# Patient Record
Sex: Female | Born: 1970 | Race: White | Hispanic: No | Marital: Married | Smoking: Current every day smoker
Health system: Southern US, Community
[De-identification: ages and names within clinical notes are randomized; demographics above are authoritative.]

## PROBLEM LIST (undated history)

## (undated) DIAGNOSIS — Z720 Tobacco use: Secondary | ICD-10-CM

## (undated) DIAGNOSIS — I5181 Takotsubo syndrome: Secondary | ICD-10-CM

## (undated) DIAGNOSIS — E785 Hyperlipidemia, unspecified: Secondary | ICD-10-CM

## (undated) DIAGNOSIS — I2510 Atherosclerotic heart disease of native coronary artery without angina pectoris: Secondary | ICD-10-CM

## (undated) DIAGNOSIS — K5090 Crohn's disease, unspecified, without complications: Secondary | ICD-10-CM

## (undated) DIAGNOSIS — I513 Intracardiac thrombosis, not elsewhere classified: Secondary | ICD-10-CM

## (undated) HISTORY — PX: CHOLECYSTECTOMY: SHX55

## (undated) HISTORY — PX: HIP SURGERY: SHX245

## (undated) HISTORY — PX: KNEE SURGERY: SHX244

## (undated) HISTORY — DX: Intracardiac thrombosis, not elsewhere classified: I51.3

## (undated) HISTORY — PX: TONSILLECTOMY: SUR1361

## (undated) HISTORY — DX: Atherosclerotic heart disease of native coronary artery without angina pectoris: I25.10

---

## 2021-07-07 ENCOUNTER — Inpatient Hospital Stay (HOSPITAL_COMMUNITY)
Admission: RE | Admit: 2021-07-07 | Discharge: 2021-07-10 | DRG: 251 | Disposition: A | Discharge status: Home / Self Care | Payer: BC Managed Care – PPO | Source: Other Acute Inpatient Hospital | Attending: Cardiovascular Disease | Admitting: Cardiovascular Disease

## 2021-07-07 ENCOUNTER — Encounter (HOSPITAL_COMMUNITY): Payer: Self-pay | Admitting: Cardiovascular Disease

## 2021-07-07 ENCOUNTER — Inpatient Hospital Stay (HOSPITAL_COMMUNITY)
Admission: RE | Disposition: A | Discharge status: Home / Self Care | Payer: Self-pay | Source: Other Acute Inpatient Hospital | Attending: Cardiovascular Disease

## 2021-07-07 DIAGNOSIS — Z20822 Contact with and (suspected) exposure to covid-19: Secondary | ICD-10-CM | POA: Diagnosis present

## 2021-07-07 DIAGNOSIS — I2510 Atherosclerotic heart disease of native coronary artery without angina pectoris: Secondary | ICD-10-CM | POA: Diagnosis present

## 2021-07-07 DIAGNOSIS — K5090 Crohn's disease, unspecified, without complications: Secondary | ICD-10-CM | POA: Diagnosis present

## 2021-07-07 DIAGNOSIS — Z79899 Other long term (current) drug therapy: Secondary | ICD-10-CM | POA: Diagnosis not present

## 2021-07-07 DIAGNOSIS — I513 Intracardiac thrombosis, not elsewhere classified: Secondary | ICD-10-CM

## 2021-07-07 DIAGNOSIS — I2102 ST elevation (STEMI) myocardial infarction involving left anterior descending coronary artery: Secondary | ICD-10-CM | POA: Diagnosis present

## 2021-07-07 DIAGNOSIS — Z9049 Acquired absence of other specified parts of digestive tract: Secondary | ICD-10-CM

## 2021-07-07 DIAGNOSIS — I213 ST elevation (STEMI) myocardial infarction of unspecified site: Secondary | ICD-10-CM | POA: Diagnosis not present

## 2021-07-07 DIAGNOSIS — F17210 Nicotine dependence, cigarettes, uncomplicated: Secondary | ICD-10-CM | POA: Diagnosis present

## 2021-07-07 DIAGNOSIS — I255 Ischemic cardiomyopathy: Secondary | ICD-10-CM | POA: Diagnosis present

## 2021-07-07 DIAGNOSIS — E785 Hyperlipidemia, unspecified: Secondary | ICD-10-CM

## 2021-07-07 HISTORY — PX: CORONARY/GRAFT ACUTE MI REVASCULARIZATION: CATH118305

## 2021-07-07 HISTORY — DX: Hyperlipidemia, unspecified: E78.5

## 2021-07-07 HISTORY — DX: ST elevation (STEMI) myocardial infarction of unspecified site: I21.3

## 2021-07-07 HISTORY — DX: Crohn's disease, unspecified, without complications: K50.90

## 2021-07-07 HISTORY — DX: Tobacco use: Z72.0

## 2021-07-07 HISTORY — PX: LEFT HEART CATH AND CORONARY ANGIOGRAPHY: CATH118249

## 2021-07-07 HISTORY — DX: Takotsubo syndrome: I51.81

## 2021-07-07 LAB — COMPREHENSIVE METABOLIC PANEL
ALT: 36 U/L (ref 0–44)
AST: 46 U/L — ABNORMAL HIGH (ref 15–41)
Albumin: 3.5 g/dL (ref 3.5–5.0)
Alkaline Phosphatase: 71 U/L (ref 38–126)
Anion gap: 10 (ref 5–15)
BUN: 9 mg/dL (ref 6–20)
CO2: 20 mmol/L — ABNORMAL LOW (ref 22–32)
Calcium: 8.4 mg/dL — ABNORMAL LOW (ref 8.9–10.3)
Chloride: 98 mmol/L (ref 98–111)
Creatinine, Ser: 0.78 mg/dL (ref 0.44–1.00)
GFR, Estimated: >60 mL/min (ref >60)
Glucose, Bld: 108 mg/dL — ABNORMAL HIGH (ref 70–99)
Potassium: 3.4 mmol/L — ABNORMAL LOW (ref 3.5–5.1)
Sodium: 128 mmol/L — ABNORMAL LOW (ref 135–145)
Total Bilirubin: 0.6 mg/dL (ref 0.3–1.2)
Total Protein: 5.9 g/dL — ABNORMAL LOW (ref 6.5–8.1)

## 2021-07-07 LAB — LIPID PANEL
Cholesterol: 239 mg/dL — ABNORMAL HIGH (ref 0–200)
HDL: 43 mg/dL (ref >40)
LDL Cholesterol: 165 mg/dL — ABNORMAL HIGH (ref 0–99)
Total CHOL/HDL Ratio: 5.6 RATIO
Triglycerides: 153 mg/dL — ABNORMAL HIGH (ref <150)
VLDL: 31 mg/dL (ref 0–40)

## 2021-07-07 LAB — CBC WITH DIFFERENTIAL/PLATELET
Abs Immature Granulocytes: 0.08 10*3/uL — ABNORMAL HIGH (ref 0.00–0.07)
Basophils Absolute: 0.1 10*3/uL (ref 0.0–0.1)
Basophils Relative: 0 %
Eosinophils Absolute: 0 10*3/uL (ref 0.0–0.5)
Eosinophils Relative: 0 %
HCT: 35.1 % — ABNORMAL LOW (ref 36.0–46.0)
Hemoglobin: 11.9 g/dL — ABNORMAL LOW (ref 12.0–15.0)
Immature Granulocytes: 1 %
Lymphocytes Relative: 20 %
Lymphs Abs: 3.1 10*3/uL (ref 0.7–4.0)
MCH: 30.1 pg (ref 26.0–34.0)
MCHC: 33.9 g/dL (ref 30.0–36.0)
MCV: 88.9 fL (ref 80.0–100.0)
Monocytes Absolute: 0.6 10*3/uL (ref 0.1–1.0)
Monocytes Relative: 4 %
Neutro Abs: 11.9 10*3/uL — ABNORMAL HIGH (ref 1.7–7.7)
Neutrophils Relative %: 75 %
Platelets: 440 10*3/uL — ABNORMAL HIGH (ref 150–400)
RBC: 3.95 MIL/uL (ref 3.87–5.11)
RDW: 13.2 % (ref 11.5–15.5)
WBC: 15.7 10*3/uL — ABNORMAL HIGH (ref 4.0–10.5)
nRBC: 0 % (ref 0.0–0.2)

## 2021-07-07 LAB — PROTIME-INR
INR: 1.5 — ABNORMAL HIGH (ref 0.8–1.2)
Prothrombin Time: 17.8 seconds — ABNORMAL HIGH (ref 11.4–15.2)

## 2021-07-07 LAB — RESP PANEL BY RT-PCR (FLU A&B, COVID) ARPGX2
Influenza A by PCR: NEGATIVE
Influenza B by PCR: NEGATIVE
SARS Coronavirus 2 by RT PCR: NEGATIVE

## 2021-07-07 LAB — TROPONIN I (HIGH SENSITIVITY): Troponin I (High Sensitivity): 2247 ng/L (ref <18)

## 2021-07-07 LAB — APTT: aPTT: >200 seconds (ref 24–36)

## 2021-07-07 LAB — POCT ACTIVATED CLOTTING TIME: Activated Clotting Time: 444 seconds

## 2021-07-07 LAB — MRSA NEXT GEN BY PCR, NASAL: MRSA by PCR Next Gen: NOT DETECTED

## 2021-07-07 SURGERY — CORONARY/GRAFT ACUTE MI REVASCULARIZATION
Anesthesia: LOCAL

## 2021-07-07 MED ORDER — LIDOCAINE HCL (PF) 1 % IJ SOLN
INTRAMUSCULAR | Status: DC | PRN
  Administered 2021-07-07: 2 mL

## 2021-07-07 MED ORDER — ATORVASTATIN CALCIUM 80 MG PO TABS
80.0000 mg | ORAL_TABLET | Freq: Every day | ORAL | Status: DC
  Administered 2021-07-08 – 2021-07-10 (×3): 80 mg via ORAL
  Filled 2021-07-07 (×3): qty 1

## 2021-07-07 MED ORDER — VERAPAMIL HCL 2.5 MG/ML IV SOLN
INTRAVENOUS | Status: AC
  Filled 2021-07-07: qty 2

## 2021-07-07 MED ORDER — FENTANYL CITRATE (PF) 100 MCG/2ML IJ SOLN
INTRAMUSCULAR | Status: AC
  Filled 2021-07-07: qty 2

## 2021-07-07 MED ORDER — IOHEXOL 350 MG/ML SOLN
INTRAVENOUS | Status: DC | PRN
  Administered 2021-07-07: 90 mL

## 2021-07-07 MED ORDER — TIROFIBAN HCL IN NACL 5-0.9 MG/100ML-% IV SOLN
INTRAVENOUS | Status: AC
  Filled 2021-07-07: qty 100

## 2021-07-07 MED ORDER — SODIUM CHLORIDE 0.9% FLUSH: 3.0000 mL | INTRAVENOUS | Status: DC | PRN

## 2021-07-07 MED ORDER — HYDRALAZINE HCL 20 MG/ML IJ SOLN: 10.0000 mg | INTRAMUSCULAR | Status: AC | PRN

## 2021-07-07 MED ORDER — SODIUM CHLORIDE 0.9 % IV SOLN: 250.0000 mL | INTRAVENOUS | Status: DC | PRN

## 2021-07-07 MED ORDER — MIDAZOLAM HCL 2 MG/2ML IJ SOLN
INTRAMUSCULAR | Status: DC | PRN
  Administered 2021-07-07: 2 mg via INTRAVENOUS

## 2021-07-07 MED ORDER — OXYCODONE HCL 5 MG PO TABS
5.0000 mg | ORAL_TABLET | ORAL | Status: DC | PRN
  Administered 2021-07-08 – 2021-07-09 (×4): 5 mg via ORAL
  Filled 2021-07-07 (×4): qty 1

## 2021-07-07 MED ORDER — SODIUM CHLORIDE 0.9% FLUSH
3.0000 mL | Freq: Two times a day (BID) | INTRAVENOUS | Status: DC
  Administered 2021-07-08 – 2021-07-10 (×6): 3 mL via INTRAVENOUS

## 2021-07-07 MED ORDER — SODIUM CHLORIDE 0.9 % IV SOLN: INTRAVENOUS | Status: AC

## 2021-07-07 MED ORDER — HEPARIN (PORCINE) IN NACL 1000-0.9 UT/500ML-% IV SOLN
INTRAVENOUS | Status: AC
  Filled 2021-07-07: qty 1000

## 2021-07-07 MED ORDER — HEPARIN SODIUM (PORCINE) 1000 UNIT/ML IJ SOLN
INTRAMUSCULAR | Status: DC | PRN
  Administered 2021-07-07: 6000 [IU] via INTRAVENOUS
  Administered 2021-07-07: 4000 [IU] via INTRAVENOUS

## 2021-07-07 MED ORDER — VERAPAMIL HCL 2.5 MG/ML IV SOLN
INTRAVENOUS | Status: DC | PRN
  Administered 2021-07-07: 10 mL via INTRA_ARTERIAL

## 2021-07-07 MED ORDER — LABETALOL HCL 5 MG/ML IV SOLN: 10.0000 mg | INTRAVENOUS | Status: AC | PRN

## 2021-07-07 MED ORDER — POTASSIUM CHLORIDE CRYS ER 20 MEQ PO TBCR
40.0000 meq | EXTENDED_RELEASE_TABLET | Freq: Once | ORAL | Status: AC
  Administered 2021-07-07: 40 meq via ORAL
  Filled 2021-07-07: qty 2

## 2021-07-07 MED ORDER — MELATONIN 3 MG PO TABS
3.0000 mg | ORAL_TABLET | Freq: Every day | ORAL | Status: DC
  Administered 2021-07-07 – 2021-07-09 (×3): 3 mg via ORAL
  Filled 2021-07-07 (×3): qty 1

## 2021-07-07 MED ORDER — METOPROLOL TARTRATE 25 MG PO TABS
25.0000 mg | ORAL_TABLET | Freq: Two times a day (BID) | ORAL | Status: DC
  Administered 2021-07-07 – 2021-07-10 (×6): 25 mg via ORAL
  Filled 2021-07-07 (×6): qty 1

## 2021-07-07 MED ORDER — TIROFIBAN HCL IN NACL 5-0.9 MG/100ML-% IV SOLN
INTRAVENOUS | Status: AC | PRN
  Administered 2021-07-07: .15 ug/kg/min via INTRAVENOUS

## 2021-07-07 MED ORDER — LIDOCAINE HCL (PF) 1 % IJ SOLN
INTRAMUSCULAR | Status: AC
  Filled 2021-07-07: qty 30

## 2021-07-07 MED ORDER — FENTANYL CITRATE (PF) 100 MCG/2ML IJ SOLN
INTRAMUSCULAR | Status: DC | PRN
  Administered 2021-07-07: 50 ug via INTRAVENOUS

## 2021-07-07 MED ORDER — MIDAZOLAM HCL 2 MG/2ML IJ SOLN
INTRAMUSCULAR | Status: AC
  Filled 2021-07-07: qty 2

## 2021-07-07 MED ORDER — ACETAMINOPHEN 325 MG PO TABS
650.0000 mg | ORAL_TABLET | ORAL | Status: DC | PRN
  Administered 2021-07-07 – 2021-07-09 (×4): 650 mg via ORAL
  Filled 2021-07-07 (×4): qty 2

## 2021-07-07 MED ORDER — HEPARIN (PORCINE) IN NACL 1000-0.9 UT/500ML-% IV SOLN
INTRAVENOUS | Status: DC | PRN
  Administered 2021-07-07 (×2): 500 mL

## 2021-07-07 MED ORDER — TIROFIBAN (AGGRASTAT) BOLUS VIA INFUSION
INTRAVENOUS | Status: DC | PRN
  Administered 2021-07-07: 1757.5 ug via INTRAVENOUS

## 2021-07-07 MED ORDER — TICAGRELOR 90 MG PO TABS
ORAL_TABLET | ORAL | Status: DC | PRN
  Administered 2021-07-07: 180 mg via ORAL

## 2021-07-07 MED ORDER — ONDANSETRON HCL 4 MG/2ML IJ SOLN
4.0000 mg | Freq: Four times a day (QID) | INTRAMUSCULAR | Status: DC | PRN
  Administered 2021-07-08 – 2021-07-09 (×2): 4 mg via INTRAVENOUS
  Filled 2021-07-07 (×2): qty 2

## 2021-07-07 MED ORDER — ASPIRIN 81 MG PO CHEW
81.0000 mg | CHEWABLE_TABLET | Freq: Every day | ORAL | Status: DC
  Administered 2021-07-08 – 2021-07-10 (×3): 81 mg via ORAL
  Filled 2021-07-07 (×3): qty 1

## 2021-07-07 MED ORDER — TICAGRELOR 90 MG PO TABS
90.0000 mg | ORAL_TABLET | Freq: Two times a day (BID) | ORAL | Status: DC
  Administered 2021-07-08 – 2021-07-10 (×5): 90 mg via ORAL
  Filled 2021-07-07 (×5): qty 1

## 2021-07-07 MED ORDER — HEPARIN SODIUM (PORCINE) 1000 UNIT/ML IJ SOLN
INTRAMUSCULAR | Status: AC
  Filled 2021-07-07: qty 10

## 2021-07-07 MED ORDER — MORPHINE SULFATE (PF) 2 MG/ML IV SOLN: 2.0000 mg | INTRAVENOUS | Status: DC | PRN

## 2021-07-07 MED ORDER — TICAGRELOR 90 MG PO TABS
ORAL_TABLET | ORAL | Status: AC
  Filled 2021-07-07: qty 2

## 2021-07-07 MED ORDER — NITROGLYCERIN 1 MG/10 ML FOR IR/CATH LAB
INTRA_ARTERIAL | Status: AC
  Filled 2021-07-07: qty 10

## 2021-07-07 MED ORDER — TIROFIBAN HCL IN NACL 5-0.9 MG/100ML-% IV SOLN
0.1500 ug/kg/min | INTRAVENOUS | Status: AC
  Administered 2021-07-07 – 2021-07-08 (×2): 0.15 ug/kg/min via INTRAVENOUS
  Filled 2021-07-07 (×3): qty 100

## 2021-07-07 SURGICAL SUPPLY — 16 items
BALLN SAPPHIRE 2.0X12 (BALLOONS) ×2
BALLOON SAPPHIRE 2.0X12 (BALLOONS) IMPLANT
CATH INFINITI 5 FR JL3.5 (CATHETERS) ×1 IMPLANT
CATH INFINITI 5FR ANG PIGTAIL (CATHETERS) ×1 IMPLANT
CATH INFINITI JR4 5F (CATHETERS) ×1 IMPLANT
CATH VISTA GUIDE 6FR XBLAD3.5 (CATHETERS) ×1 IMPLANT
DEVICE RAD COMP TR BAND LRG (VASCULAR PRODUCTS) ×1 IMPLANT
GLIDESHEATH SLEND SS 6F .021 (SHEATH) ×1 IMPLANT
GUIDEWIRE INQWIRE 1.5J.035X260 (WIRE) IMPLANT
INQWIRE 1.5J .035X260CM (WIRE) ×2
KIT ENCORE 26 ADVANTAGE (KITS) ×1 IMPLANT
KIT HEART LEFT (KITS) ×2 IMPLANT
PACK CARDIAC CATHETERIZATION (CUSTOM PROCEDURE TRAY) ×2 IMPLANT
TRANSDUCER W/STOPCOCK (MISCELLANEOUS) ×2 IMPLANT
TUBING CIL FLEX 10 FLL-RA (TUBING) ×2 IMPLANT
WIRE COUGAR XT STRL 190CM (WIRE) ×1 IMPLANT

## 2021-07-07 NOTE — H&P (Signed)
Cardiology Admission History and Physical:   Patient ID: Abigail Finley MRN: [id number]; DOB: June 17, 1971   Admission date: 07/07/2021  PCP:  No primary care provider on file.   [facility] Providers Cardiologist:  None   New}    Chief Complaint:  Chest pain  Patient Profile:   Abigail Finley is a 51 y.o. female with history of tobacco abuse, hyperlipidemia, Crohns's disease and prior stress induced cardiomyoathy who is transferred from [facility] ED with acute MI (ST elevation inferior and anterior leads).    History of Present Illness:    [name] is a 51 y.o. female with history of tobacco abuse, hyperlipidemia, Crohns's disease and prior stress induced cardiomyoathy who is transferred from [facility] ED with acute inferior STEMI. She reports having a normal cardiac cath two years ago in [place] and was told she had pericarditis and possible a stress induced cardiomyopathy. Chest pain began this afternoon around 3 pm. Persistent chest pain and still ongoing. EKG in the ED at [facility] with 1-2 inferior ST elevation that improved on subsequent EKG. Also with anterior ST elevation. Code STEMI called by ED staff and patient transferred to [facility] for cardiac cath. Chest pain on arrival to [facility].    Past Medical History:  Diagnosis Date   Crohn disease ([place])    Hyperlipidemia    STEMI (ST elevation myocardial infarction) ([place]) 07/07/2021   Stress-induced cardiomyopathy    Tobacco abuse     Past Surgical History:  Procedure Laterality Date   CHOLECYSTECTOMY     HIP SURGERY     KNEE SURGERY     TONSILLECTOMY       Medications Prior to Admission: Prior to Admission medications   Not on File  Lexapro Reflux medication  Allergies:   Not on File  Social History:   Social History   Socioeconomic History   Marital status: Married    Spouse name: Not on file   Number of children: Not on file   Years of education: Not on file   Highest  education level: Not on file  Occupational History   Not on file  Tobacco Use   Smoking status: Every Day    Types: Cigarettes   Smokeless tobacco: Not on file  Substance and Sexual Activity   Alcohol use: Yes    Comment: social   Drug use: Not on file   Sexual activity: Not on file  Other Topics Concern   Not on file  Social History Narrative   Not on file   Social Determinants of Health   Financial Resource Strain: Not on file  Food Insecurity: Not on file  Transportation Needs: Not on file  Physical Activity: Not on file  Stress: Not on file  Social Connections: Not on file  Intimate Partner Violence: Not on file    Family History:   The patient's family history includes Heart disease in her father.    ROS:  Please see the history of present illness.  All other ROS reviewed and negative.     Physical Exam/Data:   Vitals:   07/07/21 2045 07/07/21 2050 07/07/21 2055 07/07/21 2100  BP: 139/84 (!) 141/93 (!) 147/89 (!) 147/90  Pulse: 82 86 81 79  Resp: 15 16 12 13   SpO2: 97% 98% 99% 100%  Weight:      Height:       No intake or output data in the 24 hours ending 07/07/21 2126 Last 3 Weights 07/07/2021  Weight (lbs) 155 lb  Weight (kg) 70.308 kg     Body mass index is 26.61 kg/m.  General:  Well nourished, well developed, in no acute distress HEENT: normal Neck: no JVD Vascular: No carotid bruits; Distal pulses 2+ bilaterally   Cardiac:  normal S1, S2; RRR; no murmur  Lungs:  clear to auscultation bilaterally, no wheezing, rhonchi or rales  Abd: soft, nontender, no hepatomegaly  Ext: no LE edema Musculoskeletal:  No deformities, BUE and BLE strength normal and equal Skin: warm and dry  Neuro:  CNs 2-12 intact, no focal abnormalities noted Psych:  Normal affect    EKG:  The ECG that was done  was personally reviewed and demonstrates sinus, 1 mm inferior ST elevation  Relevant CV Studies:  Cardiac cath:   2nd Mrg lesion is 100% stenosed.   Lat 2nd  Mrg lesion is 100% stenosed.   Dist LAD lesion is 100% stenosed.   Balloon angioplasty was performed using a BALLN SAPPHIRE 2.0X12.   Post intervention, there is a 100% residual stenosis.   Acute MI with occlusion of the distal/apical LAD and very distal segments of the bifurcating second obtuse marginal branch. This suggests an embolic phenomenon.  The LAD is a large caliber vessel with no other lesions noted. The distal LAD is occluded before the vessel wraps around the apex.  The circumflex is a large vessel with a small first obtuse marginal branch and a moderate caliber bifurcating second obtuse marginal branch. The very distal segments of both sub-branches of the second obtuse marginal branch are occluded. (Too small for PCI) The RCA is a normal, dominant vessel.  Mild elevation of LVEDP   Recommendations: Her presentation is classic for ACS but her coronary findings suggest possible embolic event with three branches affected (distal LAD, both distal segments of bifurcating OM). I attempted PCI of the LAD with balloon angioplasty but could not restore flow and did not place a stent. Will plan to continue DAPT with ASA/Brilinta. Aggrastat infusion for 18 hours. Echo in the am. If she has apical WMA, will need to exclude thrombus. High intensity statin, beta blocker.   Diagnostic Dominance: Right Intervention  Implants     No implant documentation for this case.   Syngo Images   Show images for CARDIAC CATHETERIZATION Images on Long Term Storage   Show images for Janaria, [place] to Procedure Log  Procedure Log    Hemo Data  Flowsheet Row Most Recent Value  AO Systolic Pressure 99991111 mmHg  AO Diastolic Pressure 78 mmHg  AO Mean 95 mmHg  LV Systolic Pressure 123456 mmHg  LV Diastolic Pressure 8 mmHg  LV EDP 18 mmHg  AOp Systolic Pressure 123456 mmHg  AOp Diastolic Pressure 84 mmHg  AOp Mean Pressure XX123456 mmHg  LVp Systolic Pressure 123456 mmHg  LVp Diastolic Pressure 8 mmHg   LVp EDP Pressure 20 mmHg    Laboratory Data:  High Sensitivity Troponin:  No results for input(s): TROPONINIHS in the last 720 hours.    ChemistryNo results for input(s): NA, K, CL, CO2, GLUCOSE, BUN, CREATININE, CALCIUM, MG, GFRNONAA, GFRAA, ANIONGAP in the last 168 hours.  No results for input(s): PROT, ALBUMIN, AST, ALT, ALKPHOS, BILITOT in the last 168 hours. Lipids No results for input(s): CHOL, TRIG, HDL, LABVLDL, LDLCALC, CHOLHDL in the last 168 hours. Hematology Recent Labs  Lab 07/07/21 2036  WBC 15.7*  RBC 3.95  HGB 11.9*  HCT 35.1*  MCV 88.9  MCH 30.1  MCHC 33.9  RDW 13.2  PLT  440*   Thyroid No results for input(s): TSH, FREET4 in the last 168 hours. BNPNo results for input(s): BNP, PROBNP in the last 168 hours.  DDimer No results for input(s): DDIMER in the last 168 hours.   Assessment and Plan:   Acute MI: Her EKG was suggestive of inferior and anterior injury. Cardiac cath with occlusion of the distal/apical LAD and very distal segments of the both of the sub-branches of OM2. This suggests an embolic event rather than plaque rupture. This does not appear to be SCAD as the remainder of her vessels are normal. I was unable to restore flow down the LAD with balloon angioplasty. Will plan to monitor in the ICU tonight. Aggrastat infusion for 18 hours. ASA/Brilinta/statin/beta blocker. Echo tomorrow to assess LV function and exclude thrombus.    Risk Assessment/Risk Scores:  {  Severity of Illness: The appropriate patient status for this patient is INPATIENT. Inpatient status is judged to be reasonable and necessary in order to provide the required intensity of service to ensure the patient's safety. The patient's presenting symptoms, physical exam findings, and initial radiographic and laboratory data in the context of their chronic comorbidities is felt to place them at high risk for further clinical deterioration. Furthermore, it is not anticipated that the patient  will be medically stable for discharge from the hospital within 2 midnights of admission.   * I certify that at the point of admission it is my clinical judgment that the patient will require inpatient hospital care spanning beyond 2 midnights from the point of admission due to high intensity of service, high risk for further deterioration and high frequency of surveillance required.*   For questions or updates, please contact [place] Please consult [email or web address] for contact info under     Signed, Lauree Chandler, MD  07/07/2021 9:26 PM

## 2021-07-08 ENCOUNTER — Other Ambulatory Visit: Payer: Self-pay

## 2021-07-08 ENCOUNTER — Inpatient Hospital Stay (HOSPITAL_COMMUNITY): Payer: BC Managed Care – PPO

## 2021-07-08 ENCOUNTER — Other Ambulatory Visit (HOSPITAL_COMMUNITY): Payer: Self-pay

## 2021-07-08 ENCOUNTER — Encounter (HOSPITAL_COMMUNITY): Payer: Self-pay | Admitting: Cardiovascular Disease

## 2021-07-08 DIAGNOSIS — I2510 Atherosclerotic heart disease of native coronary artery without angina pectoris: Secondary | ICD-10-CM

## 2021-07-08 DIAGNOSIS — I2102 ST elevation (STEMI) myocardial infarction involving left anterior descending coronary artery: Secondary | ICD-10-CM | POA: Diagnosis not present

## 2021-07-08 LAB — BASIC METABOLIC PANEL
Anion gap: 13 (ref 5–15)
BUN: 9 mg/dL (ref 6–20)
CO2: 18 mmol/L — ABNORMAL LOW (ref 22–32)
Calcium: 8.8 mg/dL — ABNORMAL LOW (ref 8.9–10.3)
Chloride: 106 mmol/L (ref 98–111)
Creatinine, Ser: 0.7 mg/dL (ref 0.44–1.00)
GFR, Estimated: >60 mL/min (ref >60)
Glucose, Bld: 96 mg/dL (ref 70–99)
Potassium: 4 mmol/L (ref 3.5–5.1)
Sodium: 137 mmol/L (ref 135–145)

## 2021-07-08 LAB — HEPATIC FUNCTION PANEL
ALT: 42 U/L (ref 0–44)
AST: 117 U/L — ABNORMAL HIGH (ref 15–41)
Albumin: 3.8 g/dL (ref 3.5–5.0)
Alkaline Phosphatase: 78 U/L (ref 38–126)
Bilirubin, Direct: 0.3 mg/dL — ABNORMAL HIGH (ref 0.0–0.2)
Indirect Bilirubin: 1 mg/dL — ABNORMAL HIGH (ref 0.3–0.9)
Total Bilirubin: 1.3 mg/dL — ABNORMAL HIGH (ref 0.3–1.2)
Total Protein: 6.3 g/dL — ABNORMAL LOW (ref 6.5–8.1)

## 2021-07-08 LAB — ECHOCARDIOGRAM COMPLETE
Area-P 1/2: 5.31 cm2
Height: 64 in
S' Lateral: 3.7 cm
Weight: 2480 oz

## 2021-07-08 LAB — HEMOGLOBIN A1C
Hgb A1c MFr Bld: 5.6 % (ref 4.8–5.6)
Mean Plasma Glucose: 114 mg/dL

## 2021-07-08 LAB — TROPONIN I (HIGH SENSITIVITY): Troponin I (High Sensitivity): 12948 ng/L (ref <18)

## 2021-07-08 MED FILL — Nitroglycerin IV Soln 100 MCG/ML in D5W: INTRA_ARTERIAL | Qty: 10 | Status: AC

## 2021-07-08 NOTE — Progress Notes (Signed)
Progress Note  Patient Name: Abigail Finley Date of Encounter: 07/08/2021  [facility] Cardiologist: Dr. Darlina Guys   Subjective   Patient was admitted last night with anterior STEMI and underwent coronary angiography revealing occluded apical LAD.  She had unsuccessful attempted apical balloon angioplasty.  She is pain-free this morning.  Inpatient Medications    Scheduled Meds:  aspirin  81 mg Oral Daily   atorvastatin  80 mg Oral Daily   melatonin  3 mg Oral QHS   metoprolol tartrate  25 mg Oral BID   sodium chloride flush  3 mL Intravenous Q12H   ticagrelor  90 mg Oral BID   Continuous Infusions:  sodium chloride     tirofiban 0.15 mcg/kg/min (07/08/21 0644)   PRN Meds: sodium chloride, acetaminophen, morphine injection, ondansetron (ZOFRAN) IV, oxyCODONE, sodium chloride flush   Vital Signs    Vitals:   07/08/21 0630 07/08/21 0645 07/08/21 0700 07/08/21 0741  BP:  (!) 135/105 (!) 140/91   Pulse: 79 61 68   Resp: (!) 27 15 18    Temp:    98.1 F (36.7 C)  TempSrc:    Oral  SpO2: 98% 98% 96%   Weight:      Height:        Intake/Output Summary (Last 24 hours) at 07/08/2021 [id number] Last data filed at 07/08/2021 0644 Gross per 24 hour  Intake 1027.95 ml  Output 351 ml  Net 676.95 ml   Last 3 Weights 07/07/2021  Weight (lbs) 155 lb  Weight (kg) 70.308 kg      Telemetry    Sinus rhythm with PVCs- Personally Reviewed  ECG    Normal sinus rhythm at 62 with inferolateral T wave inversion.- Personally Reviewed  Physical Exam   GEN: No acute distress.   Neck: No JVD Cardiac: RRR, no murmurs, rubs, or gallops.  Respiratory: Clear to auscultation bilaterally. GI: Soft, nontender, non-distended  MS: No edema; No deformity. Neuro:  Nonfocal  Psych: Normal affect   Labs    High Sensitivity Troponin:   Recent Labs  Lab 07/07/21 2036 07/08/21 0047  TROPONINIHS 2,247* 12,948*     Chemistry Recent Labs  Lab 07/07/21 2036 07/08/21 0047  NA  128* 137  K 3.4* 4.0  CL 98 106  CO2 20* 18*  GLUCOSE 108* 96  BUN 9 9  CREATININE 0.78 0.70  CALCIUM 8.4* 8.8*  PROT 5.9* 6.3*  ALBUMIN 3.5 3.8  AST 46* 117*  ALT 36 42  ALKPHOS 71 78  BILITOT 0.6 1.3*  GFRNONAA >60 >60  ANIONGAP 10 13    Lipids  Recent Labs  Lab 07/07/21 2036  CHOL 239*  TRIG 153*  HDL 43  LDLCALC 165*  CHOLHDL 5.6    Hematology Recent Labs  Lab 07/07/21 2036  WBC 15.7*  RBC 3.95  HGB 11.9*  HCT 35.1*  MCV 88.9  MCH 30.1  MCHC 33.9  RDW 13.2  PLT 440*   Thyroid No results for input(s): TSH, FREET4 in the last 168 hours.  BNPNo results for input(s): BNP, PROBNP in the last 168 hours.  DDimer No results for input(s): DDIMER in the last 168 hours.   Radiology    CARDIAC CATHETERIZATION  Result Date: 07/07/2021   2nd Mrg lesion is 100% stenosed.   Lat 2nd Mrg lesion is 100% stenosed.   Dist LAD lesion is 100% stenosed.   Balloon angioplasty was performed using a BALLN SAPPHIRE 2.0X12.   Post intervention, there is a 100% residual  stenosis. Acute MI with occlusion of the distal/apical LAD and very distal segments of the bifurcating second obtuse marginal branch. This suggests an embolic phenomenon. The LAD is a large caliber vessel with no other lesions noted. The distal LAD is occluded before the vessel wraps around the apex. The circumflex is a large vessel with a small first obtuse marginal branch and a moderate caliber bifurcating second obtuse marginal branch. The very distal segments of both sub-branches of the second obtuse marginal branch are occluded. (Too small for PCI) The RCA is a normal, dominant vessel. Mild elevation of LVEDP Recommendations: Her presentation is classic for ACS but her coronary findings suggest possible embolic event with three branches affected (distal LAD, both distal segments of bifurcating OM). I attempted PCI of the LAD with balloon angioplasty but could not restore flow and did not place a stent. Will plan to  continue DAPT with ASA/Brilinta. Aggrastat infusion for 18 hours. Echo in the am. If she has apical WMA, will need to exclude thrombus. High intensity statin, beta blocker.    Cardiac Studies   Cardiac catheterization/PCI (07/08/2021)  Conclusion      2nd Mrg lesion is 100% stenosed.   Lat 2nd Mrg lesion is 100% stenosed.   Dist LAD lesion is 100% stenosed.   Balloon angioplasty was performed using a BALLN SAPPHIRE 2.0X12.   Post intervention, there is a 100% residual stenosis.   Acute MI with occlusion of the distal/apical LAD and very distal segments of the bifurcating second obtuse marginal branch. This suggests an embolic phenomenon.  The LAD is a large caliber vessel with no other lesions noted. The distal LAD is occluded before the vessel wraps around the apex.  The circumflex is a large vessel with a small first obtuse marginal branch and a moderate caliber bifurcating second obtuse marginal branch. The very distal segments of both sub-branches of the second obtuse marginal branch are occluded. (Too small for PCI) The RCA is a normal, dominant vessel.  Mild elevation of LVEDP   Recommendations: Her presentation is classic for ACS but her coronary findings suggest possible embolic event with three branches affected (distal LAD, both distal segments of bifurcating OM). I attempted PCI of the LAD with balloon angioplasty but could not restore flow and did not place a stent. Will plan to continue DAPT with ASA/Brilinta. Aggrastat infusion for 18 hours. Echo in the am. If she has apical WMA, will need to exclude thrombus. High intensity statin, beta blocker.    Coronary Diagrams  Diagnostic Dominance: Right Intervention   Patient Profile     51 y.o. married Caucasian female originally from [place] who is here with her husband for the [employer] in [place].  She has a history of Crohn's disease and untreated hyperlipidemia.  She apparently had a myocardial infarction in  [place] 2 years ago and was told she had disease at the "tip of her heart".  She developed chest pain rating to her upper extremities last night and was brought to the emergency room where her EKG suggest that she was having anterior STEMI and was taken emergently to the Cath Lab by Dr. Angelena Form for angiography and intervention.  Assessment & Plan    1: Anterior STEMI-troponins of 13,000.  Cath showed an apical LAD occlusion as well as occlusion of the distal subbranches of OM2.  This suggests an embolic event.  She did say that she had an apical occlusion 2 years ago.  Dr. Angelena Form was unable to restore antegrade  flow to the apex of the LAD.  He did not deploy a stent but performed angioplasty.  The patient is on Aggrastat for 18 hours which will end at 4:00 this afternoon as well as aspirin and Brilinta.  2D echo is pending.  2: Hyperlipidemia-total cholesterol 239 with an LDL of 165.  We will start on high-dose atorvastatin  It is unclear the origin of her chest pain or what the "culprit vessel was although it is undeniable that her troponins were elevated and she has EKG changes.  Will await 2D echo.  Dr. Angelena Form placed her on aspirin and Brilinta.  It certainly possible this is a thromboembolic phenomenon that would better be treated with a DOAC but the exact etiology is still unclear.  We will transfer her to a telemetry bed.  For questions or updates, please contact [place] Please consult [email or web address] for contact info under        Signed, Quay Burow, MD  07/08/2021, 8:22 AM

## 2021-07-08 NOTE — Discharge Instructions (Addendum)
Medication Changes: - START Aspirin 81mg  once daily, Plavix 75mg  once daily, and Eliquis 5mg  twice daily. We will discuss how long you need to be on each of these at your follow-up visit. Please take first dose of Plavix tonight 07/10/2021 and then next dose tomorrow morning. - START Metoprolol tartrate (Lopressor) 25mg  twice daily. - START Lipitor 80mg  daily. - STOP Omeprazole and START Protonix 40mg  daily instead for reflux.  Post STEMI: NO HEAVY LIFTING X 4 WEEKS. NO SEXUAL ACTIVITY X 4 WEEKS. NO DRIVING X 2 WEEKS. NO SOAKING BATHS, HOT TUBS, POOLS, ETC., X 7 DAYS.  Radial Site Care: Refer to this sheet in the next few weeks. These instructions provide you with information on caring for yourself after your procedure. Your caregiver may also give you more specific instructions. Your treatment has been planned according to current medical practices, but problems sometimes occur. Call your caregiver if you have any problems or questions after your procedure. HOME CARE INSTRUCTIONS You may shower the day after the procedure. Remove the bandage (dressing) and gently wash the site with plain soap and water. Gently pat the site dry.  Do not apply powder or lotion to the site.  Do not submerge the affected site in water for 3 to 5 days.  Inspect the site at least twice daily.  Do not flex or bend the affected arm for 24 hours.  No lifting over 5 pounds (2.3 kg) for 5 days after your procedure.  Do not drive home if you are discharged the same day of the procedure. Have someone else drive you.  What to expect: Any bruising will usually fade within 1 to 2 weeks.  Blood that collects in the tissue (hematoma) may be painful to the touch. It should usually decrease in size and tenderness within 1 to 2 weeks.  SEEK IMMEDIATE MEDICAL CARE IF: You have unusual pain at the radial site.  You have redness, warmth, swelling, or pain at the radial site.  You have drainage (other than a small amount of blood  on the dressing).  You have chills.  You have a fever or persistent symptoms for more than 72 hours.  You have a fever and your symptoms suddenly get worse.  Your arm becomes pale, cool, tingly, or numb.  You have heavy bleeding from the site. Hold pressure on the site.   -- Information on my medicine - ELIQUIS (apixaban)  This medication education was reviewed with me or my healthcare representative as part of my discharge preparation.    Why was Eliquis prescribed for you? Eliquis was prescribed for you to reduce the risk of forming blood clots that can cause a stroke if you have a medical condition called atrial fibrillation (a type of irregular heartbeat) OR to reduce the risk of a blood clots forming after orthopedic surgery.  What do You need to know about Eliquis ? Take your Eliquis TWICE DAILY - one tablet in the morning and one tablet in the evening with or without food.  It would be best to take the doses about the same time each day.  If you have difficulty swallowing the tablet whole please discuss with your pharmacist how to take the medication safely.  Take Eliquis exactly as prescribed by your doctor and DO NOT stop taking Eliquis without talking to the doctor who prescribed the medication.  Stopping may increase your risk of developing a new clot or stroke.  Refill your prescription before you run out.  After discharge,  you should have regular check-up appointments with your healthcare provider that is prescribing your Eliquis.  In the future your dose may need to be changed if your kidney function or weight changes by a significant amount or as you get older.  What do you do if you miss a dose? If you miss a dose, take it as soon as you remember on the same day and resume taking twice daily.  Do not take more than one dose of ELIQUIS at the same time.  Important Safety Information A possible side effect of Eliquis is bleeding. You should call your healthcare  provider right away if you experience any of the following: Bleeding from an injury or your nose that does not stop. Unusual colored urine (red or dark brown) or unusual colored stools (red or black). Unusual bruising for unknown reasons. A serious fall or if you hit your head (even if there is no bleeding).  Some medicines may interact with Eliquis and might increase your risk of bleeding or clotting while on Eliquis. To help avoid this, consult your healthcare provider or pharmacist prior to using any new prescription or non-prescription medications, including herbals, vitamins, non-steroidal anti-inflammatory drugs (NSAIDs) and supplements.  This website has more information on Eliquis (apixaban): http://www.eliquis.com/eliquis/home

## 2021-07-08 NOTE — Discharge Summary (Addendum)
Discharge Summary    Patient ID: Abigail Finley MRN: [id number]; DOB: [birth year]  Admit date: 07/07/2021 Discharge date: 07/10/2021  PCP:  Pcp, No   [facility] Providers Cardiologist:  Lauree Chandler, MD   {  Discharge Diagnoses    Principal Problem:   Acute ST elevation myocardial infarction (STEMI) due to occlusion of left anterior descending (LAD) coronary artery [facility]) Active Problems:   CAD (coronary artery disease)   Ischemic cardiomyopathy   LV (left ventricular) mural thrombus   Hyperlipidemia    Diagnostic Studies/Procedures    Left Cardiac Catheterization 07/07/2021:   2nd Mrg lesion is 100% stenosed.   Lat 2nd Mrg lesion is 100% stenosed.   Dist LAD lesion is 100% stenosed.   Balloon angioplasty was performed using a BALLN SAPPHIRE 2.0X12.   Post intervention, there is a 100% residual stenosis.   Acute MI with occlusion of the distal/apical LAD and very distal segments of the bifurcating second obtuse marginal branch. This suggests an embolic phenomenon.  The LAD is a large caliber vessel with no other lesions noted. The distal LAD is occluded before the vessel wraps around the apex.  The circumflex is a large vessel with a small first obtuse marginal branch and a moderate caliber bifurcating second obtuse marginal branch. The very distal segments of both sub-branches of the second obtuse marginal branch are occluded. (Too small for PCI) The RCA is a normal, dominant vessel.  Mild elevation of LVEDP   Recommendations: Her presentation is classic for ACS but her coronary findings suggest possible embolic event with three branches affected (distal LAD, both distal segments of bifurcating OM). I attempted PCI of the LAD with balloon angioplasty but could not restore flow and did not place a stent. Will plan to continue DAPT with ASA/Brilinta. Aggrastat infusion for 18 hours. Echo in the am. If she has apical WMA, will need to exclude thrombus. High intensity  statin, beta blocker.  Diagnostic Dominance: Right  Intervention   _____________  Complete Echocardiogram 07/08/2021: Impressions: 1. LV basal and mid segments are moving well with apical akinesis  consistent with apical LAD disease. LV strain worse in the apical  segments. Left ventricular ejection fraction, by estimation, is 50 to 55%.  The left ventricle has low normal function.  The left ventricle demonstrates regional wall motion abnormalities (see  scoring diagram/findings for description). Left ventricular diastolic  parameters were normal.   2. Right ventricular systolic function is normal. The right ventricular  size is normal. Tricuspid regurgitation signal is inadequate for assessing  PA pressure.   3. The mitral valve is grossly normal. No evidence of mitral valve  regurgitation.   4. The aortic valve is tricuspid. Aortic valve regurgitation is not  visualized. No aortic stenosis is present.   5. The inferior vena cava is normal in size with greater than 50%  respiratory variability, suggesting right atrial pressure of 3 mmHg.   Comparison(s): No prior Echocardiogram.  _______________  Limited Echocardiogram 07/09/2021: Impressions: 1. Limited echo with contrast EF 45-50% septal and apical akinesis After  review of the non contrast and constrast images I suspect there is a small  apical thrombus with undulating spontaneous contrast. Give cath showing  likely embolic event to  circumflex and distal LAD would anticoagulate for 6 months and can have  outpatient cardiac MRI to further evaluate.    History of Present Illness     Abigail Finley is a 51 y.o. female with a history of stress induced cardiomyopathy, hyperlipidemia, Crohn's  disease, and tobacco abuse who was transferred who presented to [facility] on 07/07/2021 with chest pain and was found to have a STEMI.   Patient reported having a norma cardiac catheterization 2 year ago in [place] and was  told at that time she had pericarditis and possible stress induced cardiac catheterization. Patient reported chest pain that began around 3pm on 07/07/2021. Pain persisted so she went to the [facility] ED where EKG showed showed ST elevations in inferior leads and anterior leads. Code STEMI was called and she was transferred to [facility] for cardiac catheterization. She was still having chest pain on arrival to [facility].  Hospital Course     Consultants: None  Acute STEMI Ischemic Cardiomyopathy LV Thrombus  Patient was admitted on 07/07/2021 for acute STEMI as stated above. High-sensitivity troponin peaked at 12,948. Emergent cardiac catheterization showed 100% stenosis of distal OM2, 100% stenosis of distal lateral branch of OM2, and 100% stenosis of distal LAD suggestive of an embolic phenomenon. PCI with balloon angioplasty of distal LAD lesion was attempted but could not restore flow and stent was not placed. He was treated with Aggrastat infusion and started on DAPT with Aspirin and Brilinta. Patient tolerated procedure well with no recurrent chest pain. Echo showed LVEF of 50-55% with apical akinesis. Repeat limited Echo on 07/09/2021 showed LVEF of 45-50% with septal and apical akinesis with suspect small apical thrombus with undulating spontaneous contrast. Therefore, we will stop Brilinta and start Plavix. Per Dr. Johnsie Cancel, no Plavix load necessary given she did not have PCI. Will get cardiac MRI to confirm LV thrombus and outpatient Event Monitor to rule out atrial fibrillation. Will likely be on triple therapy for 1 month at which time Plavix can likely be stopped per Dr. Johnsie Cancel. She was started on beta-blocker and high-intensity statin. BP soft at times so will hold on adding ACEi/ARB for now. May be able to add at outpatient follow-up.  Hyperlipidemia Lipid panel this admission: Total Cholesterol 239, Triglycerides 153, HDL 43, LDL 165. LDL goal <70 given CAD. Started on Lipitor 80mg   daily. Will need repeat lipid panel and LFTs in 6-8 weeks.   Patient seen and examined by Dr. Johnsie Cancel and determined to be stable for discharge. Outpatient follow-up will be arranged. Medications as below.  Did the patient have an acute coronary syndrome (MI, NSTEMI, STEMI, etc) this admission?:  Yes                               AHA/ACC Clinical Performance & Quality Measures: Aspirin prescribed? - Yes ADP Receptor Inhibitor (Plavix/Clopidogrel, Brilinta/Ticagrelor or Effient/Prasugrel) prescribed (includes medically managed patients)? - Yes Beta Blocker prescribed? - Yes High Intensity Statin (Lipitor 40-80mg  or Crestor 20-40mg ) prescribed? - Yes EF assessed during THIS hospitalization? - Yes For EF <40%, was ACEI/ARB prescribed? - Not Applicable (EF >/= [id number]) For EF <40%, Aldosterone Antagonist (Spironolactone or Eplerenone) prescribed? - Not Applicable (EF >/= [id number]) Cardiac Rehab Phase II ordered (including medically managed patients)? - Yes   The patient will be scheduled for a TOC follow up appointment within 2 weeks.  A message has been sent to the [facility] and Scheduling Pool at the office where the patient should be seen for follow up.  _____________  Discharge Vitals Blood pressure 109/75, pulse 66, temperature 98 F (36.7 C), temperature source Oral, resp. rate 16, height 5\' 4"  (1.626 m), weight 70.4 kg, SpO2 97 %.  Filed Weights  07/07/21 2015 07/10/21 0523  Weight: 70.3 kg 70.4 kg    Labs & Radiologic Studies    CBC Recent Labs    07/07/21 2036 07/10/21 0749  WBC 15.7* 9.9  NEUTROABS 11.9*  --   HGB 11.9* 12.3  HCT 35.1* 38.2  MCV 88.9 92.5  PLT 440* [id number]   Basic Metabolic Panel Recent Labs    07/08/21 0047 07/10/21 0749  NA 137 136  K 4.0 4.3  CL 106 105  CO2 18* 18*  GLUCOSE 96 101*  BUN 9 10  CREATININE 0.70 0.59  CALCIUM 8.8* 8.9   Liver Function Tests Recent Labs    07/07/21 2036 07/08/21 0047  AST 46* 117*  ALT 36 42  ALKPHOS 71 78   BILITOT 0.6 1.3*  PROT 5.9* 6.3*  ALBUMIN 3.5 3.8   No results for input(s): LIPASE, AMYLASE in the last 72 hours. High Sensitivity Troponin:   Recent Labs  Lab 07/07/21 2036 07/08/21 0047  TROPONINIHS 2,247* 12,948*    BNP Invalid input(s): POCBNP D-Dimer No results for input(s): DDIMER in the last 72 hours. Hemoglobin A1C Recent Labs    07/07/21 2036  HGBA1C 5.6   Fasting Lipid Panel Recent Labs    07/07/21 2036  CHOL 239*  HDL 43  LDLCALC 165*  TRIG 153*  CHOLHDL 5.6   Thyroid Function Tests No results for input(s): TSH, T4TOTAL, T3FREE, THYROIDAB in the last 72 hours.  Invalid input(s): FREET3 _____________  CARDIAC CATHETERIZATION  Result Date: 07/07/2021   2nd Mrg lesion is 100% stenosed.   Lat 2nd Mrg lesion is 100% stenosed.   Dist LAD lesion is 100% stenosed.   Balloon angioplasty was performed using a BALLN SAPPHIRE 2.0X12.   Post intervention, there is a 100% residual stenosis. Acute MI with occlusion of the distal/apical LAD and very distal segments of the bifurcating second obtuse marginal branch. This suggests an embolic phenomenon. The LAD is a large caliber vessel with no other lesions noted. The distal LAD is occluded before the vessel wraps around the apex. The circumflex is a large vessel with a small first obtuse marginal branch and a moderate caliber bifurcating second obtuse marginal branch. The very distal segments of both sub-branches of the second obtuse marginal branch are occluded. (Too small for PCI) The RCA is a normal, dominant vessel. Mild elevation of LVEDP Recommendations: Her presentation is classic for ACS but her coronary findings suggest possible embolic event with three branches affected (distal LAD, both distal segments of bifurcating OM). I attempted PCI of the LAD with balloon angioplasty but could not restore flow and did not place a stent. Will plan to continue DAPT with ASA/Brilinta. Aggrastat infusion for 18 hours. Echo in the am.  If she has apical WMA, will need to exclude thrombus. High intensity statin, beta blocker.   ECHOCARDIOGRAM COMPLETE  Result Date: 07/08/2021    ECHOCARDIOGRAM REPORT   Patient Name:   Abigail Finley Date of Exam: 07/08/2021 Medical Rec #:  [id number]       Height:       64.0 in Accession #:    [id number]      Weight:       155.0 lb Date of Birth:  [birth year]       BSA:          1.756 m Patient Age:    13 years        BP:           120/73 mmHg Patient Gender:  F               HR:           64 bpm. Exam Location:  Inpatient Procedure: 2D Echo, 3D Echo, Cardiac Doppler, Color Doppler and Strain Analysis Indications:    CAD Native Vessel I25.10  History:        Patient has prior history of Echocardiogram examinations, most                 recent 04/12/2019.  Sonographer:    Darlina Sicilian RDCS Referring Phys: [place]  1. LV basal and mid segments are moving well with apical akinesis consistent with apical LAD disease. LV strain worse in the apical segments. Left ventricular ejection fraction, by estimation, is 50 to 55%. The left ventricle has low normal function. The left ventricle demonstrates regional wall motion abnormalities (see scoring diagram/findings for description). Left ventricular diastolic parameters were normal.  2. Right ventricular systolic function is normal. The right ventricular size is normal. Tricuspid regurgitation signal is inadequate for assessing PA pressure.  3. The mitral valve is grossly normal. No evidence of mitral valve regurgitation.  4. The aortic valve is tricuspid. Aortic valve regurgitation is not visualized. No aortic stenosis is present.  5. The inferior vena cava is normal in size with greater than 50% respiratory variability, suggesting right atrial pressure of 3 mmHg. Comparison(s): No prior Echocardiogram. FINDINGS  Left Ventricle: LV basal and mid segments are moving well with apical akinesis consistent with apical LAD disease. LV strain  worse in the apical segments. Left ventricular ejection fraction, by estimation, is 50 to 55%. The left ventricle has low normal  function. The left ventricle demonstrates regional wall motion abnormalities. The left ventricular internal cavity size was normal in size. There is no left ventricular hypertrophy. Left ventricular diastolic parameters were normal. Right Ventricle: The right ventricular size is normal. No increase in right ventricular wall thickness. Right ventricular systolic function is normal. Tricuspid regurgitation signal is inadequate for assessing PA pressure. Left Atrium: Left atrial size was normal in size. Right Atrium: Right atrial size was normal in size. Pericardium: There is no evidence of pericardial effusion. Mitral Valve: The mitral valve is grossly normal. No evidence of mitral valve regurgitation. Tricuspid Valve: The tricuspid valve is normal in structure. Tricuspid valve regurgitation is trivial. Aortic Valve: The aortic valve is tricuspid. Aortic valve regurgitation is not visualized. No aortic stenosis is present. Pulmonic Valve: The pulmonic valve was not well visualized. Pulmonic valve regurgitation is not visualized. Aorta: The aortic root and ascending aorta are structurally normal, with no evidence of dilitation. Venous: The inferior vena cava is normal in size with greater than 50% respiratory variability, suggesting right atrial pressure of 3 mmHg. IAS/Shunts: The interatrial septum was not well visualized.  LEFT VENTRICLE PLAX 2D LVIDd:         5.00 cm   Diastology LVIDs:         3.70 cm   LV e' medial:    5.87 cm/s LV PW:         0.90 cm   LV E/e' medial:  13.2 LV IVS:        0.90 cm   LV e' lateral:   7.72 cm/s LVOT diam:     1.80 cm   LV E/e' lateral: 10.1 LVOT Area:     2.54 cm  3D Volume EF:                          3D EF:        50 %                          LV EDV:       122 ml                          LV ESV:       60 ml                           LV SV:        62 ml RIGHT VENTRICLE RV S prime:     8.70 cm/s TAPSE (M-mode): 1.5 cm LEFT ATRIUM             Index        RIGHT ATRIUM           Index LA diam:        3.10 cm 1.77 cm/m   RA Area:     11.00 cm LA Vol (A2C):   41.8 ml 23.81 ml/m  RA Volume:   27.10 ml  15.44 ml/m LA Vol (A4C):   38.6 ml 21.99 ml/m LA Biplane Vol: 40.1 ml 22.84 ml/m   AORTA Ao Root diam: 3.00 cm Ao Asc diam:  3.00 cm MITRAL VALVE MV Area (PHT): 5.31 cm    SHUNTS MV Decel Time: 143 msec    Systemic Diam: 1.80 cm MV E velocity: 77.60 cm/s MV A velocity: 63.80 cm/s MV E/A ratio:  1.22 Landscape architect signed by Phineas Inches Signature Date/Time: 07/08/2021/9:57:19 AM    Final    ECHOCARDIOGRAM LIMITED  Result Date: 07/09/2021    ECHOCARDIOGRAM LIMITED REPORT   Patient Name:   Abigail Finley Date of Exam: 07/09/2021 Medical Rec #:  [id number]       Height:       64.0 in Accession #:    [id number]      Weight:       155.0 lb Date of Birth:  1970-10-01       BSA:          1.756 m Patient Age:    47 years        BP:           115/66 mmHg Patient Gender: F               HR:           70 bpm. Exam Location:  Inpatient Procedure: Limited Echo and Intracardiac Opacification Agent Indications:    Myocardial infarct  History:        Patient has prior history of Echocardiogram examinations, most                 recent 07/08/2021.  Sonographer:    Clayton Lefort RDCS (AE) Referring Phys: [place]  1. Limited echo with contrast EF 45-50% septal and apical akinesis After review of the non contrast and constrast images I suspect there is a small apical thrombus with undulating spontaneous contrast. Give cath showing likely embolic event to circumflex and distal LAD would anticoagulate for 6 months and can have outpatient cardiac MRI to further evaluate. FINDINGS  Left Ventricle: Definity contrast  agent was given IV to delineate the left ventricular endocardial borders. Additional Comments: Limited echo with  contrast EF 45-50% septal and apical akinesis After review of the non contrast and constrast images I suspect there is a small apical thrombus with undulating spontaneous contrast. Give cath showing likely embolic event to circumflex and distal LAD would anticoagulate for 6 months and can have outpatient cardiac MRI to further evaluate. Jenkins Rouge MD Electronically signed by Jenkins Rouge MD Signature Date/Time: 07/09/2021/4:40:15 PM    Final    Disposition   Patient is being discharged home today in good condition.  Follow-up Plans & Appointments     Follow-up Information     Burnell Blanks, MD Follow up.   Specialty: Cardiology Why: Our office will call you to schedule cardiac MRI and hospital follow-up visit. If you do not hear from [place] within 2 business days, please call our office. Contact information: [address] [place] [address] [phone]                Discharge Instructions     Amb Referral to Cardiac Rehabilitation   Complete by: As directed    Diagnosis: STEMI   After initial evaluation and assessments completed: Virtual Based Care may be provided alone or in conjunction with Phase 2 Cardiac Rehab based on patient barriers.: Yes   Diet - low sodium heart healthy   Complete by: As directed    Increase activity slowly   Complete by: As directed        Discharge Medications   Allergies as of 07/10/2021   No Known Allergies      Medication List     STOP taking these medications    omeprazole 20 MG capsule Commonly known as: PRILOSEC       TAKE these medications    acetaminophen 325 MG tablet Commonly known as: TYLENOL Take 650 mg by mouth every 6 (six) hours as needed for mild pain, fever or headache.   albuterol 108 (90 Base) MCG/ACT inhaler Commonly known as: VENTOLIN HFA Inhale 2 puffs into the lungs 4 (four) times daily as needed for wheezing or shortness of breath.   apixaban 5 MG Tabs tablet Commonly  known as: ELIQUIS Take 1 tablet (5 mg total) by mouth 2 (two) times daily.   aspirin 81 MG chewable tablet Chew 1 tablet (81 mg total) by mouth daily. Start taking on: July 11, 2021   atorvastatin 80 MG tablet Commonly known as: LIPITOR Take 1 tablet (80 mg total) by mouth daily. Start taking on: July 11, 2021   clopidogrel 75 MG tablet Commonly known as: Plavix Take 1 tablet (75 mg total) by mouth daily.   escitalopram 10 MG tablet Commonly known as: LEXAPRO Take 10 mg by mouth daily.   metoprolol tartrate 25 MG tablet Commonly known as: LOPRESSOR Take 1 tablet (25 mg total) by mouth 2 (two) times daily.   nitroGLYCERIN 0.4 MG SL tablet Commonly known as: Nitrostat Place 1 tablet (0.4 mg total) under the tongue every 5 (five) minutes as needed for chest pain.   pantoprazole 40 MG tablet Commonly known as: Protonix Take 1 tablet (40 mg total) by mouth daily.           Outstanding Labs/Studies   Repeat lipid panel and LFTs in 6-8 weeks.  Duration of Discharge Encounter   Greater than 30 minutes including physician time.  Signed, Darreld Mclean, PA-C 07/10/2021, 10:07 AM

## 2021-07-08 NOTE — Progress Notes (Signed)
°  Echocardiogram 2D Echocardiogram has been performed.  Abigail Finley M 07/08/2021, 9:33 AM

## 2021-07-08 NOTE — Care Management (Signed)
07-08-21 1341 Case Manager asked for benefits check for Brilinta- no pharmacy benefits were showing for the patient. Case Manager spoke with the patient and she states she gets medications from [facility] [address] in [place]. Patient states she pays no more than $13.00 for her Rx's. Case Manager called and the Pharmacy was on lunch-will try to call back to make sure patient has Rx drug coverage on file. No further needs noted at this time.

## 2021-07-08 NOTE — Progress Notes (Signed)
CARDIAC REHAB PHASE I   MI education completed with pt. Pt educated on importance of ASA, Brilinta, and NTG. Pt given MI book along with heart healthy diet. Pt open to smoking cessation, but needs her husband on the same page. Reviewed site care, restrictions, and exercise guidelines. Pt states she will be staying in the area until around March. Will refer to [facility] [place].  [id number] [name], RN BSN 07/08/2021 11:21 AM

## 2021-07-09 ENCOUNTER — Other Ambulatory Visit: Payer: Self-pay | Admitting: Physician Assistant

## 2021-07-09 ENCOUNTER — Inpatient Hospital Stay (HOSPITAL_COMMUNITY): Payer: BC Managed Care – PPO

## 2021-07-09 DIAGNOSIS — I2102 ST elevation (STEMI) myocardial infarction involving left anterior descending coronary artery: Secondary | ICD-10-CM | POA: Diagnosis not present

## 2021-07-09 DIAGNOSIS — I749 Embolism and thrombosis of unspecified artery: Secondary | ICD-10-CM

## 2021-07-09 LAB — ECHOCARDIOGRAM LIMITED
Height: 64 in
Weight: 2480 oz

## 2021-07-09 MED ORDER — APIXABAN 5 MG PO TABS
5.0000 mg | ORAL_TABLET | Freq: Two times a day (BID) | ORAL | Status: DC
  Administered 2021-07-09 – 2021-07-10 (×2): 5 mg via ORAL
  Filled 2021-07-09 (×2): qty 1

## 2021-07-09 MED ORDER — PERFLUTREN LIPID MICROSPHERE
1.0000 mL | INTRAVENOUS | Status: AC | PRN
  Administered 2021-07-09: 6 mL via INTRAVENOUS
  Filled 2021-07-09: qty 10

## 2021-07-09 NOTE — Progress Notes (Signed)
CARDIAC REHAB PHASE I   Reinforced MI education with pt. Reviewed possibility of blood thinner and event monitor. Reviewed signs and symptoms of bleeding. Pt voices understanding. Questions and concerns addressed. Encouraged continued ambulation with emphasis on safety. Pt requesting nicotine patch and to restart her home medications, RN aware. Referred to CRP [facility] [place].  [id number] [name], RN BSN 07/09/2021 12:16 PM

## 2021-07-09 NOTE — Progress Notes (Signed)
°  Echocardiogram 2D Echocardiogram has been performed.  Abigail Finley 07/09/2021, 2:59 PM

## 2021-07-09 NOTE — Progress Notes (Signed)
Progress Note  Patient Name: Abigail Finley Date of Encounter: 07/09/2021  [facility] Cardiologist: Dr. Darlina Guys   Subjective   No angina   Inpatient Medications    Scheduled Meds:  aspirin  81 mg Oral Daily   atorvastatin  80 mg Oral Daily   melatonin  3 mg Oral QHS   metoprolol tartrate  25 mg Oral BID   sodium chloride flush  3 mL Intravenous Q12H   ticagrelor  90 mg Oral BID   Continuous Infusions:  sodium chloride     PRN Meds: sodium chloride, acetaminophen, morphine injection, ondansetron (ZOFRAN) IV, oxyCODONE, sodium chloride flush   Vital Signs    Vitals:   07/08/21 2055 07/09/21 0028 07/09/21 0536 07/09/21 0848  BP: 138/79 117/76 119/77 117/80  Pulse: 69 71 75 75  Resp: 20 20 19 15   Temp: 98.3 F (36.8 C) 98.4 F (36.9 C) 97.8 F (36.6 C) 98.4 F (36.9 C)  TempSrc: Oral Oral Oral Oral  SpO2: 97% 98% 98% 99%  Weight:      Height:       No intake or output data in the 24 hours ending 07/09/21 0919  Last 3 Weights 07/07/2021  Weight (lbs) 155 lb  Weight (kg) 70.308 kg      Telemetry    Sinus rhythm with PVCs- Personally Reviewed  ECG    Normal sinus rhythm at 62 with inferolateral T wave inversion.- Personally Reviewed  Physical Exam   Affect appropriate Healthy:  appears stated age HEENT: normal Neck supple with no adenopathy JVP normal no bruits no thyromegaly Lungs clear with no wheezing and good diaphragmatic motion Heart:  S1/S2 no murmur, no rub, gallop or click PMI normal Abdomen: benighn, BS positve, no tenderness, no AAA no bruit.  No HSM or HJR Distal pulses intact with no bruits No edema Neuro non-focal Skin warm and dry No muscular weakness Right radial cath site A   Labs    High Sensitivity Troponin:   Recent Labs  Lab 07/07/21 2036 07/08/21 0047  TROPONINIHS 2,247* 12,948*     Chemistry Recent Labs  Lab 07/07/21 2036 07/08/21 0047  NA 128* 137  K 3.4* 4.0  CL 98 106  CO2 20* 18*  GLUCOSE  108* 96  BUN 9 9  CREATININE 0.78 0.70  CALCIUM 8.4* 8.8*  PROT 5.9* 6.3*  ALBUMIN 3.5 3.8  AST 46* 117*  ALT 36 42  ALKPHOS 71 78  BILITOT 0.6 1.3*  GFRNONAA >60 >60  ANIONGAP 10 13    Lipids  Recent Labs  Lab 07/07/21 2036  CHOL 239*  TRIG 153*  HDL 43  LDLCALC 165*  CHOLHDL 5.6    Hematology Recent Labs  Lab 07/07/21 2036  WBC 15.7*  RBC 3.95  HGB 11.9*  HCT 35.1*  MCV 88.9  MCH 30.1  MCHC 33.9  RDW 13.2  PLT 440*     Radiology    CARDIAC CATHETERIZATION  Result Date: 07/07/2021   2nd Mrg lesion is 100% stenosed.   Lat 2nd Mrg lesion is 100% stenosed.   Dist LAD lesion is 100% stenosed.   Balloon angioplasty was performed using a BALLN SAPPHIRE 2.0X12.   Post intervention, there is a 100% residual stenosis. Acute MI with occlusion of the distal/apical LAD and very distal segments of the bifurcating second obtuse marginal branch. This suggests an embolic phenomenon. The LAD is a large caliber vessel with no other lesions noted. The distal LAD is occluded before the vessel  wraps around the apex. The circumflex is a large vessel with a small first obtuse marginal branch and a moderate caliber bifurcating second obtuse marginal branch. The very distal segments of both sub-branches of the second obtuse marginal branch are occluded. (Too small for PCI) The RCA is a normal, dominant vessel. Mild elevation of LVEDP Recommendations: Her presentation is classic for ACS but her coronary findings suggest possible embolic event with three branches affected (distal LAD, both distal segments of bifurcating OM). I attempted PCI of the LAD with balloon angioplasty but could not restore flow and did not place a stent. Will plan to continue DAPT with ASA/Brilinta. Aggrastat infusion for 18 hours. Echo in the am. If she has apical WMA, will need to exclude thrombus. High intensity statin, beta blocker.   ECHOCARDIOGRAM COMPLETE  Result Date: 07/08/2021    ECHOCARDIOGRAM REPORT   Patient  Name:   Abigail Finley Date of Exam: 07/08/2021 Medical Rec #:  [id number]       Height:       64.0 in Accession #:    [id number]      Weight:       155.0 lb Date of Birth:  1971-04-29       BSA:          1.756 m Patient Age:    51 years        BP:           120/73 mmHg Patient Gender: F               HR:           64 bpm. Exam Location:  Inpatient Procedure: 2D Echo, 3D Echo, Cardiac Doppler, Color Doppler and Strain Analysis Indications:    CAD Native Vessel I25.10  History:        Patient has prior history of Echocardiogram examinations, most                 recent 04/12/2019.  Sonographer:    Darlina Sicilian RDCS Referring Phys: [place]  1. LV basal and mid segments are moving well with apical akinesis consistent with apical LAD disease. LV strain worse in the apical segments. Left ventricular ejection fraction, by estimation, is 50 to 55%. The left ventricle has low normal function. The left ventricle demonstrates regional wall motion abnormalities (see scoring diagram/findings for description). Left ventricular diastolic parameters were normal.  2. Right ventricular systolic function is normal. The right ventricular size is normal. Tricuspid regurgitation signal is inadequate for assessing PA pressure.  3. The mitral valve is grossly normal. No evidence of mitral valve regurgitation.  4. The aortic valve is tricuspid. Aortic valve regurgitation is not visualized. No aortic stenosis is present.  5. The inferior vena cava is normal in size with greater than 50% respiratory variability, suggesting right atrial pressure of 3 mmHg. Comparison(s): No prior Echocardiogram. FINDINGS  Left Ventricle: LV basal and mid segments are moving well with apical akinesis consistent with apical LAD disease. LV strain worse in the apical segments. Left ventricular ejection fraction, by estimation, is 50 to 55%. The left ventricle has low normal  function. The left ventricle demonstrates regional wall  motion abnormalities. The left ventricular internal cavity size was normal in size. There is no left ventricular hypertrophy. Left ventricular diastolic parameters were normal. Right Ventricle: The right ventricular size is normal. No increase in right ventricular wall thickness. Right ventricular systolic function is normal. Tricuspid regurgitation signal is inadequate for  assessing PA pressure. Left Atrium: Left atrial size was normal in size. Right Atrium: Right atrial size was normal in size. Pericardium: There is no evidence of pericardial effusion. Mitral Valve: The mitral valve is grossly normal. No evidence of mitral valve regurgitation. Tricuspid Valve: The tricuspid valve is normal in structure. Tricuspid valve regurgitation is trivial. Aortic Valve: The aortic valve is tricuspid. Aortic valve regurgitation is not visualized. No aortic stenosis is present. Pulmonic Valve: The pulmonic valve was not well visualized. Pulmonic valve regurgitation is not visualized. Aorta: The aortic root and ascending aorta are structurally normal, with no evidence of dilitation. Venous: The inferior vena cava is normal in size with greater than 50% respiratory variability, suggesting right atrial pressure of 3 mmHg. IAS/Shunts: The interatrial septum was not well visualized.  LEFT VENTRICLE PLAX 2D LVIDd:         5.00 cm   Diastology LVIDs:         3.70 cm   LV e' medial:    5.87 cm/s LV PW:         0.90 cm   LV E/e' medial:  13.2 LV IVS:        0.90 cm   LV e' lateral:   7.72 cm/s LVOT diam:     1.80 cm   LV E/e' lateral: 10.1 LVOT Area:     2.54 cm                           3D Volume EF:                          3D EF:        50 %                          LV EDV:       122 ml                          LV ESV:       60 ml                          LV SV:        62 ml RIGHT VENTRICLE RV S prime:     8.70 cm/s TAPSE (M-mode): 1.5 cm LEFT ATRIUM             Index        RIGHT ATRIUM           Index LA diam:        3.10 cm 1.77  cm/m   RA Area:     11.00 cm LA Vol (A2C):   41.8 ml 23.81 ml/m  RA Volume:   27.10 ml  15.44 ml/m LA Vol (A4C):   38.6 ml 21.99 ml/m LA Biplane Vol: 40.1 ml 22.84 ml/m   AORTA Ao Root diam: 3.00 cm Ao Asc diam:  3.00 cm MITRAL VALVE MV Area (PHT): 5.31 cm    SHUNTS MV Decel Time: 143 msec    Systemic Diam: 1.80 cm MV E velocity: 77.60 cm/s MV A velocity: 63.80 cm/s MV E/A ratio:  1.22 Placido Sou signed by Phineas Inches Signature Date/Time: 07/08/2021/9:57:19 AM    Final     Cardiac Studies   Cardiac catheterization/PCI (07/08/2021)  Conclusion  2nd Mrg lesion is 100% stenosed.   Lat 2nd Mrg lesion is 100% stenosed.   Dist LAD lesion is 100% stenosed.   Balloon angioplasty was performed using a BALLN SAPPHIRE 2.0X12.   Post intervention, there is a 100% residual stenosis.   Acute MI with occlusion of the distal/apical LAD and very distal segments of the bifurcating second obtuse marginal branch. This suggests an embolic phenomenon.  The LAD is a large caliber vessel with no other lesions noted. The distal LAD is occluded before the vessel wraps around the apex.  The circumflex is a large vessel with a small first obtuse marginal branch and a moderate caliber bifurcating second obtuse marginal branch. The very distal segments of both sub-branches of the second obtuse marginal branch are occluded. (Too small for PCI) The RCA is a normal, dominant vessel.  Mild elevation of LVEDP   Recommendations: Her presentation is classic for ACS but her coronary findings suggest possible embolic event with three branches affected (distal LAD, both distal segments of bifurcating OM). I attempted PCI of the LAD with balloon angioplasty but could not restore flow and did not place a stent. Will plan to continue DAPT with ASA/Brilinta. Aggrastat infusion for 18 hours. Echo in the am. If she has apical WMA, will need to exclude thrombus. High intensity statin, beta blocker.    Coronary  Diagrams  Diagnostic Dominance: Right Intervention   Patient Profile     51 y.o. married Caucasian female originally from [place] who is here with her husband for the [employer] in [place].  She has a history of Crohn's disease and untreated hyperlipidemia.  She apparently had a myocardial infarction in [place] 2 years ago and was told she had disease at the "tip of her heart".  She developed chest pain rating to her upper extremities last night and was brought to the emergency room where her EKG suggest that she was having anterior STEMI and was taken emergently to the Cath Lab by Dr. Angelena Form for angiography and intervention.  Assessment & Plan    1: Anterior STEMI-troponins of 13,000.  Cath showed an apical LAD occlusion as well as occlusion of the distal subbranches of OM2.  This suggests an embolic event.  TTE with apical wall motion but no definity to r/o mural thrombus ordered this yesterday. She is on ASA/Brillinta. Will review echo and given possible embolic event may need DOAC if apical thrombus seen Would also get 30 day monitor to r/o PAF   2: Hyperlipidemia-total cholesterol 239 with an LDL of 165.  Lipitor 80 mg started   Possible d/c in am       Signed, Jenkins Rouge, MD  07/09/2021, 9:19 AM

## 2021-07-10 ENCOUNTER — Other Ambulatory Visit: Payer: Self-pay | Admitting: Student

## 2021-07-10 DIAGNOSIS — I2510 Atherosclerotic heart disease of native coronary artery without angina pectoris: Secondary | ICD-10-CM

## 2021-07-10 DIAGNOSIS — E785 Hyperlipidemia, unspecified: Secondary | ICD-10-CM

## 2021-07-10 DIAGNOSIS — I2102 ST elevation (STEMI) myocardial infarction involving left anterior descending coronary artery: Secondary | ICD-10-CM | POA: Diagnosis not present

## 2021-07-10 DIAGNOSIS — I513 Intracardiac thrombosis, not elsewhere classified: Secondary | ICD-10-CM

## 2021-07-10 DIAGNOSIS — I255 Ischemic cardiomyopathy: Secondary | ICD-10-CM

## 2021-07-10 LAB — CBC
HCT: 38.2 % (ref 36.0–46.0)
Hemoglobin: 12.3 g/dL (ref 12.0–15.0)
MCH: 29.8 pg (ref 26.0–34.0)
MCHC: 32.2 g/dL (ref 30.0–36.0)
MCV: 92.5 fL (ref 80.0–100.0)
Platelets: 358 10*3/uL (ref 150–400)
RBC: 4.13 MIL/uL (ref 3.87–5.11)
RDW: 13.2 % (ref 11.5–15.5)
WBC: 9.9 10*3/uL (ref 4.0–10.5)
nRBC: 0.2 % (ref 0.0–0.2)

## 2021-07-10 LAB — BASIC METABOLIC PANEL
Anion gap: 13 (ref 5–15)
BUN: 10 mg/dL (ref 6–20)
CO2: 18 mmol/L — ABNORMAL LOW (ref 22–32)
Calcium: 8.9 mg/dL (ref 8.9–10.3)
Chloride: 105 mmol/L (ref 98–111)
Creatinine, Ser: 0.59 mg/dL (ref 0.44–1.00)
GFR, Estimated: >60 mL/min (ref >60)
Glucose, Bld: 101 mg/dL — ABNORMAL HIGH (ref 70–99)
Potassium: 4.3 mmol/L (ref 3.5–5.1)
Sodium: 136 mmol/L (ref 135–145)

## 2021-07-10 MED ORDER — APIXABAN 5 MG PO TABS: 5.0000 mg | ORAL_TABLET | Freq: Two times a day (BID) | ORAL | 5 refills | Status: DC

## 2021-07-10 MED ORDER — CLOPIDOGREL BISULFATE 75 MG PO TABS: 75.0000 mg | ORAL_TABLET | Freq: Every day | ORAL | 2 refills | Status: DC

## 2021-07-10 MED ORDER — PANTOPRAZOLE SODIUM 40 MG PO TBEC: 40.0000 mg | DELAYED_RELEASE_TABLET | Freq: Every day | ORAL | 2 refills | Status: AC

## 2021-07-10 MED ORDER — NITROGLYCERIN 0.4 MG SL SUBL: 0.4000 mg | SUBLINGUAL_TABLET | SUBLINGUAL | 2 refills | Status: AC | PRN

## 2021-07-10 MED ORDER — METOPROLOL TARTRATE 25 MG PO TABS: 25.0000 mg | ORAL_TABLET | Freq: Two times a day (BID) | ORAL | 2 refills | Status: DC

## 2021-07-10 MED ORDER — ASPIRIN 81 MG PO CHEW: 81.0000 mg | CHEWABLE_TABLET | Freq: Every day | ORAL | Status: AC

## 2021-07-10 MED ORDER — ATORVASTATIN CALCIUM 80 MG PO TABS
80.0000 mg | ORAL_TABLET | Freq: Every day | ORAL | 2 refills | Status: AC
Start: 2021-07-11 — End: ?

## 2021-07-10 NOTE — TOC Transition Note (Signed)
Transition of Care [facility]) - CM/SW Discharge Note   Patient Details  Name: Abigail Finley MRN: [id number] Date of Birth: [birth year]  [facility]) CM/SW Contact:  Bess Kinds, RN Phone Number: [phone] 07/10/2021, 2:49 PM   Clinical Narrative:     Patient transitioned home before NCM could speak with patient. Noted discharged on new prescription for eliquis. Unable to provide free trial card. Patient noted to have secondary insurance [employer] which is likely her source for pharmacy benefit. No other TOC needs identified.   Final next level of care: Home/Self Care Barriers to Discharge: No Barriers Identified   Patient Goals and CMS Choice        Discharge Placement                       Discharge Plan and Services                                     Social Determinants of Health (SDOH) Interventions     Readmission Risk Interventions No flowsheet data found.

## 2021-07-10 NOTE — Progress Notes (Signed)
Progress Note  Patient Name: Azya Celik Date of Encounter: 07/10/2021  [facility] Cardiologist: Dr. Darlina Guys   Subjective   No angina Discussed need for anticoagulation with likely LV thrombus  Inpatient Medications    Scheduled Meds:  apixaban  5 mg Oral BID   aspirin  81 mg Oral Daily   atorvastatin  80 mg Oral Daily   melatonin  3 mg Oral QHS   metoprolol tartrate  25 mg Oral BID   sodium chloride flush  3 mL Intravenous Q12H   Continuous Infusions:  sodium chloride     PRN Meds: sodium chloride, acetaminophen, morphine injection, ondansetron (ZOFRAN) IV, oxyCODONE, sodium chloride flush   Vital Signs    Vitals:   07/09/21 2058 07/09/21 2117 07/10/21 0523 07/10/21 0757  BP: 117/76 117/76 (!) 99/55 109/75  Pulse: 72 81 67 66  Resp: 20  18 16   Temp: 98.3 F (36.8 C)  98 F (36.7 C) 98 F (36.7 C)  TempSrc: Oral  Oral Oral  SpO2: 99%  96% 97%  Weight:   70.4 kg   Height:       No intake or output data in the 24 hours ending 07/10/21 0958  Last 3 Weights 07/10/2021 07/07/2021  Weight (lbs) 155 lb 3.3 oz 155 lb  Weight (kg) 70.4 kg 70.308 kg      Telemetry    Sinus rhythm with PVCs- Personally Reviewed  ECG    Normal sinus rhythm at 62 with inferolateral T wave inversion.- Personally Reviewed  Physical Exam   Affect appropriate Healthy:  appears stated age HEENT: normal Neck supple with no adenopathy JVP normal no bruits no thyromegaly Lungs clear with no wheezing and good diaphragmatic motion Heart:  S1/S2 no murmur, no rub, gallop or click PMI normal Abdomen: benighn, BS positve, no tenderness, no AAA no bruit.  No HSM or HJR Distal pulses intact with no bruits No edema Neuro non-focal Skin warm and dry No muscular weakness Right radial cath site A   Labs    High Sensitivity Troponin:   Recent Labs  Lab 07/07/21 2036 07/08/21 0047  TROPONINIHS 2,247* 12,948*     Chemistry Recent Labs  Lab 07/07/21 2036  07/08/21 0047 07/10/21 0749  NA 128* 137 136  K 3.4* 4.0 4.3  CL 98 106 105  CO2 20* 18* 18*  GLUCOSE 108* 96 101*  BUN 9 9 10   CREATININE 0.78 0.70 0.59  CALCIUM 8.4* 8.8* 8.9  PROT 5.9* 6.3*  --   ALBUMIN 3.5 3.8  --   AST 46* 117*  --   ALT 36 42  --   ALKPHOS 71 78  --   BILITOT 0.6 1.3*  --   GFRNONAA >60 >60 >60  ANIONGAP 10 13 13     Lipids  Recent Labs  Lab 07/07/21 2036  CHOL 239*  TRIG 153*  HDL 43  LDLCALC 165*  CHOLHDL 5.6    Hematology Recent Labs  Lab 07/07/21 2036 07/10/21 0749  WBC 15.7* 9.9  RBC 3.95 4.13  HGB 11.9* 12.3  HCT 35.1* 38.2  MCV 88.9 92.5  MCH 30.1 29.8  MCHC 33.9 32.2  RDW 13.2 13.2  PLT 440* 358     Radiology    ECHOCARDIOGRAM LIMITED  Result Date: 07/09/2021    ECHOCARDIOGRAM LIMITED REPORT   Patient Name:   SHAQUERIA WILMOTH Date of Exam: 07/09/2021 Medical Rec #:  [id number]       Height:  64.0 in Accession #:    [id number]      Weight:       155.0 lb Date of Birth:  September 21, 1970       BSA:          1.756 m Patient Age:    51 years        BP:           115/66 mmHg Patient Gender: F               HR:           70 bpm. Exam Location:  Inpatient Procedure: Limited Echo and Intracardiac Opacification Agent Indications:    Myocardial infarct  History:        Patient has prior history of Echocardiogram examinations, most                 recent 07/08/2021.  Sonographer:    Clayton Lefort RDCS (AE) Referring Phys: [place]  1. Limited echo with contrast EF 45-50% septal and apical akinesis After review of the non contrast and constrast images I suspect there is a small apical thrombus with undulating spontaneous contrast. Give cath showing likely embolic event to circumflex and distal LAD would anticoagulate for 6 months and can have outpatient cardiac MRI to further evaluate. FINDINGS  Left Ventricle: Definity contrast agent was given IV to delineate the left ventricular endocardial borders. Additional Comments: Limited  echo with contrast EF 45-50% septal and apical akinesis After review of the non contrast and constrast images I suspect there is a small apical thrombus with undulating spontaneous contrast. Give cath showing likely embolic event to circumflex and distal LAD would anticoagulate for 6 months and can have outpatient cardiac MRI to further evaluate. Jenkins Rouge MD Electronically signed by Jenkins Rouge MD Signature Date/Time: 07/09/2021/4:40:15 PM    Final     Cardiac Studies   Cardiac catheterization/PCI (07/08/2021)  Conclusion      2nd Mrg lesion is 100% stenosed.   Lat 2nd Mrg lesion is 100% stenosed.   Dist LAD lesion is 100% stenosed.   Balloon angioplasty was performed using a BALLN SAPPHIRE 2.0X12.   Post intervention, there is a 100% residual stenosis.   Acute MI with occlusion of the distal/apical LAD and very distal segments of the bifurcating second obtuse marginal branch. This suggests an embolic phenomenon.  The LAD is a large caliber vessel with no other lesions noted. The distal LAD is occluded before the vessel wraps around the apex.  The circumflex is a large vessel with a small first obtuse marginal branch and a moderate caliber bifurcating second obtuse marginal branch. The very distal segments of both sub-branches of the second obtuse marginal branch are occluded. (Too small for PCI) The RCA is a normal, dominant vessel.  Mild elevation of LVEDP   Recommendations: Her presentation is classic for ACS but her coronary findings suggest possible embolic event with three branches affected (distal LAD, both distal segments of bifurcating OM). I attempted PCI of the LAD with balloon angioplasty but could not restore flow and did not place a stent. Will plan to continue DAPT with ASA/Brilinta. Aggrastat infusion for 18 hours. Echo in the am. If she has apical WMA, will need to exclude thrombus. High intensity statin, beta blocker.    Coronary Diagrams  Diagnostic Dominance:  Right Intervention   Patient Profile     51 y.o. married Caucasian female originally from [place] who is here with her  husband for the [employer] in [place].  She has a history of Crohn's disease and untreated hyperlipidemia.  She apparently had a myocardial infarction in [place] 2 years ago and was told she had disease at the "tip of her heart".  She developed chest pain rating to her upper extremities last night and was brought to the emergency room where her EKG suggest that she was having anterior STEMI and was taken emergently to the Cath Lab by Dr. Angelena Form for angiography and intervention.  Assessment & Plan    1: Anterior STEMI-troponins of 13,000.  Cath showed an apical LAD occlusion as well as occlusion of the distal subbranches of OM2.  This suggests an embolic event.  Reviewed TTE and contrast study and likely apical thrombus. Can do outpatient cardiac MRI to further evaluate Started eliquis last night Change Brillinta  To plavix and continue ASA/  would anticoagulate for 6 months Can stop plavix in 4 weeks since no stent placed  2: Hyperlipidemia-total cholesterol 239 with an LDL of 165.  Lipitor 80 mg started   D/c home 14 day monitor r/o PAF Cardiac MRI ? Apical thrombus  F/U CM      Signed, Jenkins Rouge, MD  07/10/2021, 9:58 AM

## 2021-07-10 NOTE — Plan of Care (Signed)

## 2021-07-11 ENCOUNTER — Telehealth: Payer: Self-pay | Admitting: *Deleted

## 2021-07-11 ENCOUNTER — Telehealth: Payer: Self-pay | Admitting: Cardiovascular Disease

## 2021-07-11 NOTE — Telephone Encounter (Signed)
Patient scheduled for 07/18/21 at 2:20pm with Gaynelle Adu for TOC per Virl Son

## 2021-07-11 NOTE — Telephone Encounter (Signed)
Was left message to have monitor shipped to her sisters.  No information on file.  Attempted calling patient with no answer. No DPR on file to leave message.

## 2021-07-12 ENCOUNTER — Encounter: Payer: Self-pay | Admitting: *Deleted

## 2021-07-12 ENCOUNTER — Telehealth: Payer: Self-pay | Admitting: *Deleted

## 2021-07-12 ENCOUNTER — Telehealth (HOSPITAL_COMMUNITY): Payer: Self-pay

## 2021-07-12 NOTE — Telephone Encounter (Signed)
Calling to make arrangements to have cardiac event monitor shipped to her daughters home in [place]. Patient stated not a good time and she has to contact daughter to get her address.  She will call back in an hour or two.

## 2021-07-12 NOTE — Telephone Encounter (Signed)
Pt is calling back to provide shipping address:  [id number] [place], [place] [address]

## 2021-07-12 NOTE — Telephone Encounter (Signed)
Per phase I cardiac rehab, fax cardiac rehab referral to [facility]. °

## 2021-07-12 NOTE — Telephone Encounter (Signed)
Patient enrolled for [employer] to ship a 30 day cardiac event monitor to her daughters address at  [place], [place]  [address] Letter with instructions mailed to same address. Billing address will be listed as  [address] [place], [place]

## 2021-07-13 ENCOUNTER — Encounter: Payer: Self-pay | Admitting: *Deleted

## 2021-07-13 DIAGNOSIS — Z006 Encounter for examination for normal comparison and control in clinical research program: Secondary | ICD-10-CM

## 2021-07-13 NOTE — Research (Signed)
V-Inception Research Study  Patient Contacted about potential participation in [employer] V-Inception.  Study was discussed with the patient and the opportunity to ask questions was given.  Patient was emailed a copy of the consent.  Patient will be contacted in 5-7 days for Follow-up phone call about information on the study.    This patient is eligible to participate in V-Inception Study.  The study is comparing the initiation of Inclisiran on top of usual care in Patient's with a recent acute coronary syndrome within the past 5 weeks.  Eligibility Criteria: recent ACS within 5 weeks, Serum LCL-C greater than or equal to 70mg /dL or non-HDL-C greater than or equal to 100mg /dL, and taking statin therapy or statin intolerant (not on PCSK9 Inhibitors).  Its a randomized, controlled, multicenter, open-label trial comparing a hospital post-discharge care pathway involving aggressive LCL-C management that includes Inclisiran with usual care versus usual care alone in patients with a recent ACS.   Jasmine Pang, RN BSN [facility] Cardiovascular Research & Education Direct Line: [phone]

## 2021-07-18 ENCOUNTER — Ambulatory Visit (INDEPENDENT_AMBULATORY_CARE_PROVIDER_SITE_OTHER): Payer: BC Managed Care – PPO | Admitting: Nurse Practitioner

## 2021-07-18 ENCOUNTER — Other Ambulatory Visit: Payer: Self-pay

## 2021-07-18 ENCOUNTER — Encounter (HOSPITAL_BASED_OUTPATIENT_CLINIC_OR_DEPARTMENT_OTHER): Payer: Self-pay | Admitting: Nurse Practitioner

## 2021-07-18 ENCOUNTER — Encounter: Payer: Self-pay | Admitting: *Deleted

## 2021-07-18 VITALS — BP 116/80 | HR 80 | Ht 64.0 in | Wt 157.0 lb

## 2021-07-18 DIAGNOSIS — I255 Ischemic cardiomyopathy: Secondary | ICD-10-CM

## 2021-07-18 DIAGNOSIS — E785 Hyperlipidemia, unspecified: Secondary | ICD-10-CM

## 2021-07-18 DIAGNOSIS — Z006 Encounter for examination for normal comparison and control in clinical research program: Secondary | ICD-10-CM

## 2021-07-18 DIAGNOSIS — I214 Non-ST elevation (NSTEMI) myocardial infarction: Secondary | ICD-10-CM | POA: Diagnosis not present

## 2021-07-18 DIAGNOSIS — Z79899 Other long term (current) drug therapy: Secondary | ICD-10-CM | POA: Diagnosis not present

## 2021-07-18 DIAGNOSIS — Z720 Tobacco use: Secondary | ICD-10-CM

## 2021-07-18 DIAGNOSIS — I2510 Atherosclerotic heart disease of native coronary artery without angina pectoris: Secondary | ICD-10-CM

## 2021-07-18 DIAGNOSIS — I513 Intracardiac thrombosis, not elsewhere classified: Secondary | ICD-10-CM

## 2021-07-18 MED ORDER — DIAZEPAM 5 MG PO TABS: ORAL_TABLET | ORAL | 0 refills | Status: DC

## 2021-07-18 MED ORDER — APIXABAN 5 MG PO TABS: 5.0000 mg | ORAL_TABLET | Freq: Two times a day (BID) | ORAL | 5 refills | Status: AC

## 2021-07-18 NOTE — Progress Notes (Signed)
Office Visit    Patient Name: Abigail Finley Date of Encounter: 07/18/2021  PCP:  Aviva Kluver   [facility]  Cardiologist:  Verne Carrow, MD  Advanced Practice Provider:  No care team member to display Electrophysiologist:  None      Chief Complaint    Abigail Finley is a 51 y.o. female with a hx of stress-induced cardiomyopathy, hyperlipidemia, Crohn's, tobacco use, LV thrombus, presents today for hospital follow-up  Past Medical History    Past Medical History:  Diagnosis Date   Crohn disease (HCC)    Hyperlipidemia    STEMI (ST elevation myocardial infarction) (HCC) 07/07/2021   Stress-induced cardiomyopathy    Tobacco abuse    Past Surgical History:  Procedure Laterality Date   CHOLECYSTECTOMY     CORONARY/GRAFT ACUTE MI REVASCULARIZATION N/A 07/07/2021   Procedure: Coronary/Graft Acute MI Revascularization;  Surgeon: Kathleene Hazel, MD;  Location: [facility] INVASIVE CV LAB;  Service: Cardiovascular;  Laterality: N/A;   HIP SURGERY     KNEE SURGERY     LEFT HEART CATH AND CORONARY ANGIOGRAPHY N/A 07/07/2021   Procedure: LEFT HEART CATH AND CORONARY ANGIOGRAPHY;  Surgeon: Kathleene Hazel, MD;  Location: [facility] CV [facility];  Service: Cardiovascular;  Laterality: N/A;   TONSILLECTOMY      Allergies  No Known Allergies  History of Present Illness    Abigail Finley is a 51 y.o. female with a hx of Crohn's disease, tobacco abuse, hyperlipidemia, and arthritis last seen while hospitalized.  She has a history of pericarditis and possible stress-induced cardiomyopathy approximately 2020 with normal cardiac catheterization in [place] at the time.  She presented 07/07/2021 for chest pain to the [facility] emergency department where EKG showed ST elevations in inferior and anterior leads and code STEMI was called.  High sensitive troponin peaked at 12,948.  Catheterization showed 100% stenosis of distal OM 2, 100% stenosis of  distal lateral branch of OM2, 100% stenosis of distal LAD suggestive of embolic phenomenon.  PCI with balloon angioplasty of distal LAD could not restore flow.  Was treated with Aggrastat infusion and started on aspirin/Brilinta.  Echo with LVEF 50 to 55% with apical akinesis.  Repeat limited echo 07/09/2021 showed LVEF 45 to 60%, septal and apical wall akinesis, suspect small apical thrombus with undulating spontaneous contrast.  Brilinta was stopped and Plavix started. Plan for cardiac MRI as outpatient. Monitor mailed to outpatient. She was discharged on aspirin, lipitor, lopressor, eliquis, plavix. Plan to stop plavix after 30 days.   Presents today for follow up. She is here alone and admits that she has anxiety about recent hospitalization.  She states that on 1/19/323 the pain was most severe in both of her arms and she could not get comfortable.  Since hospital discharge she has had occasional right arm pain.  She is anxious about whether she should be concerned about the pain and when/if she should return to the hospital. Has mild dyspnea with exertion that she feels is stable and improves with rest. She denies chest pain, lower extremity edema, fatigue, palpitations, melena, hematuria, hemoptysis, diaphoresis, weakness, presyncope, syncope, orthopnea, and PND. She is not interested in committing to cardiac rehab due to splitting her time between [place] and [place] due to her husband's job.   EKGs/Labs/Other Studies Reviewed:   The following studies were reviewed today: Left Cardiac Catheterization 07/07/2021:   2nd Mrg lesion is 100% stenosed.   Lat 2nd Mrg lesion is 100% stenosed.   Dist  LAD lesion is 100% stenosed.   Balloon angioplasty was performed using a BALLN SAPPHIRE 2.0X12.   Post intervention, there is a 100% residual stenosis.   Acute MI with occlusion of the distal/apical LAD and very distal segments of the bifurcating second obtuse marginal branch. This suggests an embolic  phenomenon.  The LAD is a large caliber vessel with no other lesions noted. The distal LAD is occluded before the vessel wraps around the apex.  The circumflex is a large vessel with a small first obtuse marginal branch and a moderate caliber bifurcating second obtuse marginal branch. The very distal segments of both sub-branches of the second obtuse marginal branch are occluded. (Too small for PCI) The RCA is a normal, dominant vessel.  Mild elevation of LVEDP   Recommendations: Her presentation is classic for ACS but her coronary findings suggest possible embolic event with three branches affected (distal LAD, both distal segments of bifurcating OM). I attempted PCI of the LAD with balloon angioplasty but could not restore flow and did not place a stent. Will plan to continue DAPT with ASA/Brilinta. Aggrastat infusion for 18 hours. Echo in the am. If she has apical WMA, will need to exclude thrombus. High intensity statin, beta blocker.   Diagnostic Dominance: Right Intervention   _____________   Complete Echocardiogram 07/08/2021: Impressions: 1. LV basal and mid segments are moving well with apical akinesis  consistent with apical LAD disease. LV strain worse in the apical  segments. Left ventricular ejection fraction, by estimation, is 50 to 55%.  The left ventricle has low normal function.  The left ventricle demonstrates regional wall motion abnormalities (see  scoring diagram/findings for description). Left ventricular diastolic  parameters were normal.   2. Right ventricular systolic function is normal. The right ventricular  size is normal. Tricuspid regurgitation signal is inadequate for assessing  PA pressure.   3. The mitral valve is grossly normal. No evidence of mitral valve  regurgitation.   4. The aortic valve is tricuspid. Aortic valve regurgitation is not  visualized. No aortic stenosis is present.   5. The inferior vena cava is normal in size with greater than 50%   respiratory variability, suggesting right atrial pressure of 3 mmHg.   Comparison(s): No prior Echocardiogram.  _______________   Limited Echocardiogram 07/09/2021: Impressions: 1. Limited echo with contrast EF 45-50% septal and apical akinesis After  review of the non contrast and constrast images I suspect there is a small  apical thrombus with undulating spontaneous contrast. Give cath showing  likely embolic event to  circumflex and distal LAD would anticoagulate for 6 months and can have  outpatient cardiac MRI to further evaluate.     EKG:  EKG is ordered today.  The ekg ordered today demonstrates NSR at rate of 80 bpm, LAD, low voltage QRS, inferior, anterolateral T wave changes consistent with previous tracing 07/08/21  Recent Labs: 07/08/2021: ALT 42 07/10/2021: BUN 10; Creatinine, Ser 0.59; Hemoglobin 12.3; Platelets 358; Potassium 4.3; Sodium 136  Recent Lipid Panel    Component Value Date/Time   CHOL 239 (H) 07/07/2021 2036   TRIG 153 (H) 07/07/2021 2036   HDL 43 07/07/2021 2036   CHOLHDL 5.6 07/07/2021 2036   VLDL 31 07/07/2021 2036   LDLCALC 165 (H) 07/07/2021 2036    Home Medications   Current Meds  Medication Sig   acetaminophen (TYLENOL) 325 MG tablet Take 650 mg by mouth every 6 (six) hours as needed for mild pain, fever or headache.  albuterol (VENTOLIN HFA) 108 (90 Base) MCG/ACT inhaler Inhale 2 puffs into the lungs 4 (four) times daily as needed for wheezing or shortness of breath.   aspirin 81 MG chewable tablet Chew 1 tablet (81 mg total) by mouth daily.   atorvastatin (LIPITOR) 80 MG tablet Take 1 tablet (80 mg total) by mouth daily.   clopidogrel (PLAVIX) 75 MG tablet Take 1 tablet (75 mg total) by mouth daily.   diazepam (VALIUM) 5 MG tablet Take 1 pill 60 minutes before your MRI. You may repeat x 1 if needed.   escitalopram (LEXAPRO) 10 MG tablet Take 10 mg by mouth daily.   metoprolol tartrate (LOPRESSOR) 25 MG tablet Take 1 tablet (25 mg total) by  mouth 2 (two) times daily.   nitroGLYCERIN (NITROSTAT) 0.4 MG SL tablet Place 1 tablet (0.4 mg total) under the tongue every 5 (five) minutes as needed for chest pain.   pantoprazole (PROTONIX) 40 MG tablet Take 1 tablet (40 mg total) by mouth daily.   [DISCONTINUED] apixaban (ELIQUIS) 5 MG TABS tablet Take 1 tablet (5 mg total) by mouth 2 (two) times daily.    Review of Systems    All other systems reviewed and are otherwise negative except as noted above.  Physical Exam    VS:  BP 116/80 (BP Location: Left Arm, Patient Position: Sitting, Cuff Size: Normal)    Pulse 80    Ht 5\' 4"  (1.626 m)    Wt 157 lb (71.2 kg)    BMI 26.95 kg/m  , BMI Body mass index is 26.95 kg/m.  Wt Readings from Last 3 Encounters:  07/18/21 157 lb (71.2 kg)  07/10/21 155 lb 3.3 oz (70.4 kg)     GEN: Well nourished, well developed, in no acute distress. HEENT: normal. Neck: Supple, no JVD, carotid bruits, or masses. Cardiac: RRR, no murmurs, rubs, or gallops. No clubbing, cyanosis, edema.  Radials/PT 2+ and equal bilaterally.  Respiratory:  Respirations regular and unlabored, clear to auscultation bilaterally. GI: Soft, nontender, nondistended. MS: No deformity or atrophy. Skin: Warm and dry, no rash. Right radial cath site without deformity Neuro:  Strength and sensation are intact. Psych: Normal affect.  Assessment & Plan    CAD - s/p STEMI 06/2021. LHC revealed an apical LAD occlusion as well as occlusion of the distal subbranches of OM2. PCI of LAD with balloon angioplasty attempted but could not restore flow and therefore no stent was placed. Due to her presentation, recommendation was to evaluate for thrombus which was identified by echo 07/09/21.  She continues to have some right arm pain that causes concern to her. I spent time explaining how to use sublingual nitroglycerin and to evaluate the pain based on other associated symptoms, increased symptoms with movement or sleeping on the arm, and gave her  ER precautions.  Plan to continue Plavix 30 days post cath. Encouraged smoking cessation. We will continue aspirin and Eliquis for now.   Ischemic cardiomyopathy - Echo 07/09/21 LVEF 45-50%.  No edema. She appears euvolemic on exam. BP remains somewhat soft today. Encouraged her to monitor BP at home in favor of adding GDMT of ACE-I/ARB/ARNI for mildly reduced LVEF at next office visit if BP allows.   LV thrombus - Echo 07/09/21 with suspect small apical thrombus. Cath showed an apical LAD occlusion as well as occlusion of the distal subbranches of OM2, felt to be suggestive of an embolic event.  She is on aspirin, Plavix, and Eliquis. She will continue Plavix until 08/07/21.  No bleeding concerns. She is scheduled for cardiac MRI on 08/08/2021. Cardiac monitor has been ordered to rule out PAF. She states the monitor has arrived at her niece's house and she will try to pick it up as soon as possible. Gave her Valium 5 mg x 2 for upcoming MRI due to claustrophobia. Would recommend recheck of CBC, BMET at next office visit.  HLD, LDL goal <70 - 06/2021 total cholesterol 239, HDL 43, LDL 165. Atorvastatin 80mg  QD initiated during admission. Encouraged heart healthy diet and increased physical activity. We will plan to recheck liver and lipid panel at next office visit.   Tobacco abuse: We discussed the importance of stopping smoking and she verbally agrees.  Has tried Wellbutrin in the past without success, said that it made her feel jittery.  She declines additional aids to help her stop smoking and states that she and her husband will need to do it together in order to be successful.  Completes cessation advised.  Medication Management: Time was spent explaining each medication to the patient and offering assistance for the cost of Eliquis.  I have encouraged her to reach out to [place] with problems in the future and due to the importance of staying on this medication.     Disposition: Follow up in 2 month(s)  with Lauree Chandler, MD or APP.  Signed, Emmaline Life, NP 07/18/2021, 4:47 PM [place]

## 2021-07-18 NOTE — Patient Instructions (Addendum)
Medication Instructions:  Your physician has recommended you make the following change in your medication:   Start: Eliquis 5mg  tablet daily   Stop Plavix after 2/19  You may take one valium before your MRI  *If you need a refill on your cardiac medications before your next appointment, please call your pharmacy*   Lab Work: None ordered today   Testing/Procedures: None ordered today   Follow-Up: At [facility], you and your health needs are our priority.  As part of our continuing mission to provide you with exceptional heart care, we have created designated Provider Care Teams.  These Care Teams include your primary Cardiologist (physician) and Advanced Practice Providers (APPs -  Physician Assistants and Nurse Practitioners) who all work together to provide you with the care you need, when you need it.  We recommend signing up for the patient portal called "MyChart".  Sign up information is provided on this After Visit Summary.  MyChart is used to connect with patients for Virtual Visits (Telemedicine).  Patients are able to view lab/test results, encounter notes, upcoming appointments, etc.  Non-urgent messages can be sent to your provider as well.   To learn more about what you can do with MyChart, go to [email or web address].    Your next appointment:   Follow up as scheduled with Dr. Angelena Form. Please come fasting to this appointment so that we may check you Lipid levels!    Please fill out your patient assistance forms an mail them in!

## 2021-07-18 NOTE — Research (Addendum)
Follow-up phone call placed to patient about participation in the V-inception research study, left message for patient to return call  07/19/21 3:04 PM  Attempted call to patient again, no answer

## 2021-07-20 ENCOUNTER — Ambulatory Visit (INDEPENDENT_AMBULATORY_CARE_PROVIDER_SITE_OTHER): Payer: BC Managed Care – PPO

## 2021-07-20 DIAGNOSIS — I749 Embolism and thrombosis of unspecified artery: Secondary | ICD-10-CM | POA: Diagnosis not present

## 2021-07-20 DIAGNOSIS — I4891 Unspecified atrial fibrillation: Secondary | ICD-10-CM | POA: Diagnosis not present

## 2021-08-05 ENCOUNTER — Telehealth (HOSPITAL_COMMUNITY): Payer: Self-pay | Admitting: *Deleted

## 2021-08-05 NOTE — Telephone Encounter (Signed)
Attempted to call patient regarding upcoming cardiac MRI appointment. Left message on voicemail with name and callback number  Earlee Herald RN Navigator [facility] [phone] Office [phone] Cell  

## 2021-08-08 ENCOUNTER — Other Ambulatory Visit: Payer: Self-pay

## 2021-08-08 ENCOUNTER — Ambulatory Visit (HOSPITAL_COMMUNITY)
Admission: RE | Admit: 2021-08-08 | Discharge: 2021-08-08 | Disposition: A | Discharge status: Home / Self Care | Payer: BC Managed Care – PPO | Source: Ambulatory Visit | Attending: Student | Admitting: Student

## 2021-08-08 DIAGNOSIS — I513 Intracardiac thrombosis, not elsewhere classified: Secondary | ICD-10-CM | POA: Insufficient documentation

## 2021-08-08 DIAGNOSIS — I255 Ischemic cardiomyopathy: Secondary | ICD-10-CM | POA: Diagnosis present

## 2021-08-08 MED ORDER — GADOBUTROL 1 MMOL/ML IV SOLN
8.0000 mL | Freq: Once | INTRAVENOUS | Status: AC | PRN
  Administered 2021-08-08: 8 mL via INTRAVENOUS

## 2021-08-12 ENCOUNTER — Ambulatory Visit (INDEPENDENT_AMBULATORY_CARE_PROVIDER_SITE_OTHER): Payer: BC Managed Care – PPO | Admitting: Cardiovascular Disease

## 2021-08-12 ENCOUNTER — Other Ambulatory Visit: Payer: Self-pay

## 2021-08-12 ENCOUNTER — Encounter: Payer: Self-pay | Admitting: Cardiovascular Disease

## 2021-08-12 VITALS — BP 110/70 | HR 76 | Ht 64.0 in | Wt 155.8 lb

## 2021-08-12 DIAGNOSIS — I255 Ischemic cardiomyopathy: Secondary | ICD-10-CM

## 2021-08-12 DIAGNOSIS — I513 Intracardiac thrombosis, not elsewhere classified: Secondary | ICD-10-CM

## 2021-08-12 DIAGNOSIS — E785 Hyperlipidemia, unspecified: Secondary | ICD-10-CM

## 2021-08-12 DIAGNOSIS — I2510 Atherosclerotic heart disease of native coronary artery without angina pectoris: Secondary | ICD-10-CM

## 2021-08-12 DIAGNOSIS — Z720 Tobacco use: Secondary | ICD-10-CM

## 2021-08-12 MED ORDER — LISINOPRIL 2.5 MG PO TABS: 2.5000 mg | ORAL_TABLET | Freq: Every day | ORAL | 3 refills | Status: DC

## 2021-08-12 NOTE — Patient Instructions (Signed)
Medication Instructions:  Your physician has recommended you make the following change in your medication:  1) START Lisinopril 2.5mg  once daily  *If you need a refill on your cardiac medications before your next appointment, please call your pharmacy*   Lab Work: In June 2023: fasting lipids, LFTs, CBC, BMET If you have labs (blood work) drawn today and your tests are completely normal, you will receive your results only by: MyChart Message (if you have MyChart) OR A paper copy in the mail If you have any lab test that is abnormal or we need to change your treatment, we will call you to review the results.   Testing/Procedures: NONE   Follow-Up: At [facility], you and your health needs are our priority.  As part of our continuing mission to provide you with exceptional heart care, we have created designated Provider Care Teams.  These Care Teams include your primary Cardiologist (physician) and Advanced Practice Providers (APPs -  Physician Assistants and Nurse Practitioners) who all work together to provide you with the care you need, when you need it.  We recommend signing up for the patient portal called "MyChart".  Sign up information is provided on this After Visit Summary.  MyChart is used to connect with patients for Virtual Visits (Telemedicine).  Patients are able to view lab/test results, encounter notes, upcoming appointments, etc.  Non-urgent messages can be sent to your provider as well.   To learn more about what you can do with MyChart, go to [email or web address].    Your next appointment:   6 month(s)  The format for your next appointment:   In Person  Provider:   Verne Carrow, MD

## 2021-08-12 NOTE — Progress Notes (Signed)
Chief Complaint  Patient presents with   Follow-up    CAD   History of Present Illness: 51 yo female with history of CAD, stress induced cardiomyopathy, hyperlipidemia, Crohn's disease, tobacco use and LV thrombus here today for cardiac follow up. She reported having had a normal cardiac cath in 2020 in [place] and was told then she had a stress induced cardiomyopathy. She was living temporarily in [place] in early 2023 and had sudden onset of chest pain prompting ED visit where here EKG ST elevation in the inferior and anterior leads. Code STEMI was called by the ED. Her cardiac cath showed thrombotic occlusion of the distal LAD and terminal segments of two obtuse marginal branches. This was felt to be consistent with an embolic event. PCI of the distal LAD attempted with balloon angioplasty but flow could not be restored. She was found to have apical akinesis on echo with probable LV thrombus. She was discharged on Plavix, Eliquis, ASA. She was seen in our office 07/18/21 for follow up and was doing well. She stopped Plavix last week. She has been on ASA and Eliquis.   She is here today for follow up. The patient denies any chest pain, dyspnea, palpitations, lower extremity edema, orthopnea, PND, dizziness, near syncope or syncope.   Primary Care Physician: System, Provider Not In   Past Medical History:  Diagnosis Date   CAD (coronary artery disease)    Crohn disease ([place])    Hyperlipidemia    LV (left ventricular) mural thrombus    STEMI (ST elevation myocardial infarction) ([facility]) 07/07/2021   Stress-induced cardiomyopathy    Tobacco abuse     Past Surgical History:  Procedure Laterality Date   CHOLECYSTECTOMY     CORONARY/GRAFT ACUTE MI REVASCULARIZATION N/A 07/07/2021   Procedure: Coronary/Graft Acute MI Revascularization;  Surgeon: Burnell Blanks, MD;  Location: [facility];  Service: Cardiovascular;  Laterality: N/A;   HIP SURGERY     KNEE SURGERY      LEFT HEART CATH AND CORONARY ANGIOGRAPHY N/A 07/07/2021   Procedure: LEFT HEART CATH AND CORONARY ANGIOGRAPHY;  Surgeon: Burnell Blanks, MD;  Location: [facility];  Service: Cardiovascular;  Laterality: N/A;   TONSILLECTOMY      Current Outpatient Medications  Medication Sig Dispense Refill   acetaminophen (TYLENOL) 325 MG tablet Take 650 mg by mouth every 6 (six) hours as needed for mild pain, fever or headache.     albuterol (VENTOLIN HFA) 108 (90 Base) MCG/ACT inhaler Inhale 2 puffs into the lungs 4 (four) times daily as needed for wheezing or shortness of breath.     apixaban (ELIQUIS) 5 MG TABS tablet Take 1 tablet (5 mg total) by mouth 2 (two) times daily. 30 tablet 5   aspirin 81 MG chewable tablet Chew 1 tablet (81 mg total) by mouth daily.     atorvastatin (LIPITOR) 80 MG tablet Take 1 tablet (80 mg total) by mouth daily. 90 tablet 2   escitalopram (LEXAPRO) 10 MG tablet Take 10 mg by mouth daily.     lisinopril (ZESTRIL) 2.5 MG tablet Take 1 tablet (2.5 mg total) by mouth daily. 90 tablet 3   metoprolol tartrate (LOPRESSOR) 25 MG tablet Take 1 tablet (25 mg total) by mouth 2 (two) times daily. 60 tablet 2   nitroGLYCERIN (NITROSTAT) 0.4 MG SL tablet Place 1 tablet (0.4 mg total) under the tongue every 5 (five) minutes as needed for chest pain. 25 tablet 2   pantoprazole (  PROTONIX) 40 MG tablet Take 1 tablet (40 mg total) by mouth daily. 30 tablet 2   No current facility-administered medications for this visit.    Allergies  Allergen Reactions   Chlorpromazine Anaphylaxis   Methylphenidate Anaphylaxis   Azathioprine     Other reaction(s): Other (see comments), Other - Describe In Comment Field Azathioprine caused jaw pain Other reaction(s): Jaw pain Jaw pain Other reaction(s): Other (see comments) Other reaction(s): Jaw pain Jaw pain Other reaction(s): Jaw pain Jaw pain Azathioprine caused jaw pain    Other Other (See Comments)   Pollen Extract Other  (See Comments)   Droperidol Anxiety    Other reaction(s): Agitation   Prochlorperazine Anxiety    Other reaction(s): Myalgia  And body aches Other reaction(s): Other (see comments) Other reaction(s): Myalgia  And body aches    Social History   Socioeconomic History   Marital status: Married    Spouse name: Not on file   Number of children: Not on file   Years of education: Not on file   Highest education level: Not on file  Occupational History   Not on file  Tobacco Use   Smoking status: Every Day    Types: Cigarettes   Smokeless tobacco: Current  Substance and Sexual Activity   Alcohol use: Yes    Comment: social   Drug use: Not on file   Sexual activity: Not on file  Other Topics Concern   Not on file  Social History Narrative   Not on file   Social Determinants of Health   Financial Resource Strain: Not on file  Food Insecurity: Not on file  Transportation Needs: Not on file  Physical Activity: Not on file  Stress: Not on file  Social Connections: Not on file  Intimate Partner Violence: Not on file    Family History  Problem Relation Age of Onset   Heart disease Father     Review of Systems:  As stated in the HPI and otherwise negative.   BP 110/70    Pulse 76    Ht 5\' 4"  (1.626 m)    Wt 155 lb 12.8 oz (70.7 kg)    SpO2 99%    BMI 26.74 kg/m   Physical Examination: General: Well developed, well nourished, NAD  HEENT: OP clear, mucus membranes moist  SKIN: warm, dry. No rashes. Neuro: No focal deficits  Musculoskeletal: Muscle strength 5/5 all ext  Psychiatric: Mood and affect normal  Neck: No JVD, no carotid bruits, no thyromegaly, no lymphadenopathy.  Lungs:Clear bilaterally, no wheezes, rhonci, crackles Cardiovascular: Regular rate and rhythm. No murmurs, gallops or rubs. Abdomen:Soft. Bowel sounds present. Non-tender.  Extremities: No lower extremity edema. Pulses are 2 + in the bilateral DP/PT.  EKG:  EKG is not ordered today. The ekg  ordered today demonstrates   Echo 07/08/21:  1. LV basal and mid segments are moving well with apical akinesis  consistent with apical LAD disease. LV strain worse in the apical  segments. Left ventricular ejection fraction, by estimation, is 50 to 55%.  The left ventricle has low normal function.  The left ventricle demonstrates regional wall motion abnormalities (see  scoring diagram/findings for description). Left ventricular diastolic  parameters were normal.   2. Right ventricular systolic function is normal. The right ventricular  size is normal. Tricuspid regurgitation signal is inadequate for assessing  PA pressure.   3. The mitral valve is grossly normal. No evidence of mitral valve  regurgitation.   4. The aortic valve  is tricuspid. Aortic valve regurgitation is not  visualized. No aortic stenosis is present.   5. The inferior vena cava is normal in size with greater than 50%  respiratory variability, suggesting right atrial pressure of 3 mmHg.   Cardiac cath 07/07/21:   2nd Mrg lesion is 100% stenosed.   Lat 2nd Mrg lesion is 100% stenosed.   Dist LAD lesion is 100% stenosed.   Balloon angioplasty was performed using a BALLN SAPPHIRE 2.0X12.   Post intervention, there is a 100% residual stenosis.   Acute MI with occlusion of the distal/apical LAD and very distal segments of the bifurcating second obtuse marginal branch. This suggests an embolic phenomenon.  The LAD is a large caliber vessel with no other lesions noted. The distal LAD is occluded before the vessel wraps around the apex.  The circumflex is a large vessel with a small first obtuse marginal branch and a moderate caliber bifurcating second obtuse marginal branch. The very distal segments of both sub-branches of the second obtuse marginal branch are occluded. (Too small for PCI) The RCA is a normal, dominant vessel.  Mild elevation of LVEDP   Recommendations: Her presentation is classic for ACS but her coronary  findings suggest possible embolic event with three branches affected (distal LAD, both distal segments of bifurcating OM). I attempted PCI of the LAD with balloon angioplasty but could not restore flow and did not place a stent. Will plan to continue DAPT with ASA/Brilinta. Aggrastat infusion for 18 hours. Echo in the am. If she has apical WMA, will need to exclude thrombus. High intensity statin, beta blocker.   Echo 07/09/21:  1. Limited echo with contrast EF 45-50% septal and apical akinesis After  review of the non contrast and constrast images I suspect there is a small  apical thrombus with undulating spontaneous contrast. Give cath showing  likely embolic event to  circumflex and distal LAD would anticoagulate for 6 months and can have  outpatient cardiac MRI to further evaluate.   FINDINGS   Left Ventricle: Definity contrast agent was given IV to delineate the  left ventricular endocardial borders.   Additional Comments: Limited echo with contrast EF 45-50% septal and  apical akinesis After review of the non contrast and constrast images I  suspect there is a small apical thrombus with undulating spontaneous  contrast. Give cath showing likely embolic  event to circumflex and distal LAD would anticoagulate for 6 months and  can have outpatient cardiac MRI to further evaluate.   Recent Labs: 07/08/2021: ALT 42 07/10/2021: BUN 10; Creatinine, Ser 0.59; Hemoglobin 12.3; Platelets 358; Potassium 4.3; Sodium 136   Lipid Panel    Component Value Date/Time   CHOL 239 (H) 07/07/2021 2036   TRIG 153 (H) 07/07/2021 2036   HDL 43 07/07/2021 2036   CHOLHDL 5.6 07/07/2021 2036   VLDL 31 07/07/2021 2036   LDLCALC 165 (H) 07/07/2021 2036     Wt Readings from Last 3 Encounters:  08/12/21 155 lb 12.8 oz (70.7 kg)  07/18/21 157 lb (71.2 kg)  07/10/21 155 lb 3.3 oz (70.4 kg)      Assessment and Plan:   1. CAD without angina: Her findings on recent cardiac cath are thought to represent  embolism of thrombus, likely LV thrombus, to the coronary arteries. She does not appear to have significant atherosclerotic CAD. Continue ASA, statin, beta blocker and Eliquis.    2. LV thrombus: No LV thrombus seen on recent echo. Continue Eliquis for now. As  it is unlikely that her wall motion abnormality will resolve, consider long term anticoagulation.   3. Cardiomyopathy: Unclear if she has an apical wall motion abnormality at baseline or if this is new as there are no prior echocardiograms here. Cardiac MRI 08/08/21 with normal LV size with apical WMA, LVEF=41%.  Continue beta blocker. Will add low dose Lisinopril 2.5 mg daily and she will follow BP at home.   4. Hyperlipidemia: Will need repeat lipids and LFTS in 2 months. Will set this up when she comes back to [place] in several months.   5. Tobacco abuse: Smoking cessation encouraged.   Labs/ tests ordered today include:   Orders Placed This Encounter  Procedures   Basic metabolic panel   Hepatic function panel   Lipid panel   CBC   Disposition:   F/U with me in 6 months.   Signed, Lauree Chandler, MD 08/12/2021 10:25 AM    [facility] [place], [place], [place]  [address] Phone: [phone]; Fax: [phone]

## 2021-08-22 ENCOUNTER — Other Ambulatory Visit: Payer: Self-pay | Admitting: Physician Assistant

## 2021-08-22 DIAGNOSIS — I4891 Unspecified atrial fibrillation: Secondary | ICD-10-CM

## 2021-08-22 DIAGNOSIS — I749 Embolism and thrombosis of unspecified artery: Secondary | ICD-10-CM

## 2021-08-26 ENCOUNTER — Telehealth: Payer: Self-pay | Admitting: Physician Assistant

## 2021-08-26 NOTE — Telephone Encounter (Signed)
Patient returning call for monitor results. 

## 2021-08-26 NOTE — Progress Notes (Signed)
Pt has been made aware of normal result and verbalized understanding.  jw

## 2021-08-26 NOTE — Telephone Encounter (Signed)
Pt was returning the call for her monitor results. ?Pt has been made aware of normal, sinus rhythm and to follow-up as planned.  ?

## 2021-12-02 ENCOUNTER — Other Ambulatory Visit: Payer: BC Managed Care – PPO

## 2021-12-08 ENCOUNTER — Other Ambulatory Visit: Payer: Self-pay | Admitting: Student

## 2022-01-23 ENCOUNTER — Encounter: Payer: Self-pay | Admitting: Cardiovascular Disease

## 2022-01-23 ENCOUNTER — Ambulatory Visit (INDEPENDENT_AMBULATORY_CARE_PROVIDER_SITE_OTHER): Payer: BC Managed Care – PPO | Admitting: Cardiovascular Disease

## 2022-01-23 VITALS — BP 120/72 | HR 77 | Ht 64.0 in | Wt 153.2 lb

## 2022-01-23 DIAGNOSIS — I255 Ischemic cardiomyopathy: Secondary | ICD-10-CM

## 2022-01-23 DIAGNOSIS — I513 Intracardiac thrombosis, not elsewhere classified: Secondary | ICD-10-CM | POA: Diagnosis not present

## 2022-01-23 DIAGNOSIS — E785 Hyperlipidemia, unspecified: Secondary | ICD-10-CM | POA: Diagnosis not present

## 2022-01-23 DIAGNOSIS — I428 Other cardiomyopathies: Secondary | ICD-10-CM | POA: Diagnosis not present

## 2022-01-23 DIAGNOSIS — I2510 Atherosclerotic heart disease of native coronary artery without angina pectoris: Secondary | ICD-10-CM | POA: Diagnosis not present

## 2022-01-23 MED ORDER — ZOLPIDEM TARTRATE 5 MG PO TABS: 5.0000 mg | ORAL_TABLET | Freq: Every evening | ORAL | 5 refills | Status: DC | PRN

## 2022-01-23 MED ORDER — METOPROLOL TARTRATE 25 MG PO TABS: ORAL_TABLET | ORAL | 11 refills | Status: AC

## 2022-01-23 NOTE — Progress Notes (Signed)
Chief Complaint  Patient presents with   Follow-up    CAD   History of Present Illness: 51 yo female with history of CAD, stress induced cardiomyopathy, hyperlipidemia, Crohn's disease, tobacco use and LV thrombus here today for cardiac follow up. She reported having had a normal cardiac cath in 2020 in [place] and was told then she had a stress induced cardiomyopathy. She was living temporarily in [place] in January 2023 and had sudden onset of chest pain prompting ED visit where her EKG showed ST elevation in the inferior and anterior leads. Code STEMI was called by the ED. Her cardiac cath showed thrombotic occlusion of the distal LAD and terminal segments of two obtuse marginal branches. This was felt to be consistent with an embolic event. PCI of the distal LAD attempted with balloon angioplasty but flow could not be restored. She was found to have apical akinesis on echo with probable LV thrombus. She was discharged on Plavix, Eliquis, ASA. She was seen in our office in January 2023 and in February 2023 for follow up and was doing well.   She is here today for follow up. The patient denies any chest pain, dyspnea, palpitations, lower extremity edema, orthopnea, PND, dizziness, near syncope or syncope.   Primary Care Physician: System, Provider Not In   Past Medical History:  Diagnosis Date   CAD (coronary artery disease)    Crohn disease ([facility])    Hyperlipidemia    LV (left ventricular) mural thrombus    STEMI (ST elevation myocardial infarction) ([facility]) 07/07/2021   Stress-induced cardiomyopathy    Tobacco abuse     Past Surgical History:  Procedure Laterality Date   CHOLECYSTECTOMY     CORONARY/GRAFT ACUTE MI REVASCULARIZATION N/A 07/07/2021   Procedure: Coronary/Graft Acute MI Revascularization;  Surgeon: Kathleene Hazel, MD;  Location: [facility];  Service: Cardiovascular;  Laterality: N/A;   HIP SURGERY     KNEE SURGERY     LEFT HEART CATH AND  CORONARY ANGIOGRAPHY N/A 07/07/2021   Procedure: LEFT HEART CATH AND CORONARY ANGIOGRAPHY;  Surgeon: Kathleene Hazel, MD;  Location: [facility];  Service: Cardiovascular;  Laterality: N/A;   TONSILLECTOMY      Current Outpatient Medications  Medication Sig Dispense Refill   acetaminophen (TYLENOL) 325 MG tablet Take 650 mg by mouth every 6 (six) hours as needed for mild pain, fever or headache.     albuterol (VENTOLIN HFA) 108 (90 Base) MCG/ACT inhaler Inhale 2 puffs into the lungs 4 (four) times daily as needed for wheezing or shortness of breath.     apixaban (ELIQUIS) 5 MG TABS tablet Take 1 tablet (5 mg total) by mouth 2 (two) times daily. 30 tablet 5   aspirin 81 MG chewable tablet Chew 1 tablet (81 mg total) by mouth daily.     atorvastatin (LIPITOR) 80 MG tablet Take 1 tablet (80 mg total) by mouth daily. 90 tablet 2   escitalopram (LEXAPRO) 10 MG tablet Take 10 mg by mouth daily.     nitroGLYCERIN (NITROSTAT) 0.4 MG SL tablet Place 1 tablet (0.4 mg total) under the tongue every 5 (five) minutes as needed for chest pain. 25 tablet 2   zolpidem (AMBIEN) 5 MG tablet Take 1 tablet (5 mg total) by mouth at bedtime as needed for sleep. 30 tablet 5   metoprolol tartrate (LOPRESSOR) 25 MG tablet TAKE 1 TABLET(25 MG) BY MOUTH TWICE DAILY 60 tablet 11   pantoprazole (PROTONIX) 40 MG tablet  Take 1 tablet (40 mg total) by mouth daily. 30 tablet 2   No current facility-administered medications for this visit.    Allergies  Allergen Reactions   Chlorpromazine Anaphylaxis   Methylphenidate Anaphylaxis   Azathioprine     Other reaction(s): Other (see comments), Other - Describe In Comment Field Azathioprine caused jaw pain Other reaction(s): Jaw pain Jaw pain Other reaction(s): Other (see comments) Other reaction(s): Jaw pain Jaw pain Other reaction(s): Jaw pain Jaw pain Azathioprine caused jaw pain    Other Other (See Comments)   Pollen Extract Other (See Comments)    Droperidol Anxiety    Other reaction(s): Agitation   Prochlorperazine Anxiety    Other reaction(s): Myalgia  And body aches Other reaction(s): Other (see comments) Other reaction(s): Myalgia  And body aches    Social History   Socioeconomic History   Marital status: Married    Spouse name: Not on file   Number of children: Not on file   Years of education: Not on file   Highest education level: Not on file  Occupational History   Not on file  Tobacco Use   Smoking status: Every Day    Types: Cigarettes   Smokeless tobacco: Current  Substance and Sexual Activity   Alcohol use: Yes    Comment: social   Drug use: Not on file   Sexual activity: Not on file  Other Topics Concern   Not on file  Social History Narrative   Not on file   Social Determinants of Health   Financial Resource Strain: Not on file  Food Insecurity: Not on file  Transportation Needs: Not on file  Physical Activity: Not on file  Stress: Not on file  Social Connections: Not on file  Intimate Partner Violence: Not on file    Family History  Problem Relation Age of Onset   Heart disease Father     Review of Systems:  As stated in the HPI and otherwise negative.   BP 120/72   Pulse 77   Ht 5\' 4"  (1.626 m)   Wt 153 lb 3.2 oz (69.5 kg)   SpO2 98%   BMI 26.30 kg/m   Physical Examination: General: Well developed, well nourished, NAD  HEENT: OP clear, mucus membranes moist  SKIN: warm, dry. No rashes. Neuro: No focal deficits  Musculoskeletal: Muscle strength 5/5 all ext  Psychiatric: Mood and affect normal  Neck: No JVD, no carotid bruits, no thyromegaly, no lymphadenopathy.  Lungs:Clear bilaterally, no wheezes, rhonci, crackles Cardiovascular: Regular rate and rhythm. No murmurs, gallops or rubs. Abdomen:Soft. Bowel sounds present. Non-tender.  Extremities: No lower extremity edema. Pulses are 2 + in the bilateral DP/PT.  EKG:  EKG is not ordered today. The ekg ordered today  demonstrates   Echo 07/08/21:  1. LV basal and mid segments are moving well with apical akinesis  consistent with apical LAD disease. LV strain worse in the apical  segments. Left ventricular ejection fraction, by estimation, is 50 to 55%.  The left ventricle has low normal function.  The left ventricle demonstrates regional wall motion abnormalities (see  scoring diagram/findings for description). Left ventricular diastolic  parameters were normal.   2. Right ventricular systolic function is normal. The right ventricular  size is normal. Tricuspid regurgitation signal is inadequate for assessing  PA pressure.   3. The mitral valve is grossly normal. No evidence of mitral valve  regurgitation.   4. The aortic valve is tricuspid. Aortic valve regurgitation is not  visualized.  No aortic stenosis is present.   5. The inferior vena cava is normal in size with greater than 50%  respiratory variability, suggesting right atrial pressure of 3 mmHg.   Cardiac cath 07/07/21:   2nd Mrg lesion is 100% stenosed.   Lat 2nd Mrg lesion is 100% stenosed.   Dist LAD lesion is 100% stenosed.   Balloon angioplasty was performed using a BALLN SAPPHIRE 2.0X12.   Post intervention, there is a 100% residual stenosis.   Acute MI with occlusion of the distal/apical LAD and very distal segments of the bifurcating second obtuse marginal branch. This suggests an embolic phenomenon.  The LAD is a large caliber vessel with no other lesions noted. The distal LAD is occluded before the vessel wraps around the apex.  The circumflex is a large vessel with a small first obtuse marginal branch and a moderate caliber bifurcating second obtuse marginal branch. The very distal segments of both sub-branches of the second obtuse marginal branch are occluded. (Too small for PCI) The RCA is a normal, dominant vessel.  Mild elevation of LVEDP   Recommendations: Her presentation is classic for ACS but her coronary findings suggest  possible embolic event with three branches affected (distal LAD, both distal segments of bifurcating OM). I attempted PCI of the LAD with balloon angioplasty but could not restore flow and did not place a stent. Will plan to continue DAPT with ASA/Brilinta. Aggrastat infusion for 18 hours. Echo in the am. If she has apical WMA, will need to exclude thrombus. High intensity statin, beta blocker.   Echo 07/09/21:  1. Limited echo with contrast EF 45-50% septal and apical akinesis After  review of the non contrast and constrast images I suspect there is a small  apical thrombus with undulating spontaneous contrast. Give cath showing  likely embolic event to  circumflex and distal LAD would anticoagulate for 6 months and can have  outpatient cardiac MRI to further evaluate.   FINDINGS   Left Ventricle: Definity contrast agent was given IV to delineate the  left ventricular endocardial borders.   Additional Comments: Limited echo with contrast EF 45-50% septal and  apical akinesis After review of the non contrast and constrast images I  suspect there is a small apical thrombus with undulating spontaneous  contrast. Give cath showing likely embolic  event to circumflex and distal LAD would anticoagulate for 6 months and  can have outpatient cardiac MRI to further evaluate.   Cardiac MRI February 2023:  Limited images of the lung fields showed no gross abnormalities.   Normal left ventricular size and wall thickness. Akinesis of the true apex and the mid-apical inferolateral and inferior walls. Overall LV EF 41%. No LV thrombus noted. Normal right ventricular size and systolic function, EF 50%. Normal right and left atrial sizes. No significant mitral regurgitation. Trileaflet aortic valve with no significant regurgitation or stenosis.   Delayed enhancement imaging:   >50% wall thickness subendocardial late gadolinium enhancement (LGE) in the mid-apical inferior and inferolateral walls and  the true apex.   MEASUREMENTS: MEASUREMENTS LVEDV 115 mL LVSV 48 mL LVEF 41%   RVEDV 103 mL RVSV 51 mL RVEF 50%   IMPRESSION: 1. Normal LV size with wall motion abnormalities as noted above. EF 41%.   2.  No LV thrombus noted.   3.  Normal RV size and systolic function, EF 50%.   4. Delayed enhancement with evidence for prior MI in the mid-apical inferior and inferolateral walls and the apex.  Recent Labs: 07/08/2021: ALT 42 07/10/2021: BUN 10; Creatinine, Ser 0.59; Hemoglobin 12.3; Platelets 358; Potassium 4.3; Sodium 136   Lipid Panel    Component Value Date/Time   CHOL 239 (H) 07/07/2021 2036   TRIG 153 (H) 07/07/2021 2036   HDL 43 07/07/2021 2036   CHOLHDL 5.6 07/07/2021 2036   VLDL 31 07/07/2021 2036   LDLCALC 165 (H) 07/07/2021 2036     Wt Readings from Last 3 Encounters:  01/23/22 153 lb 3.2 oz (69.5 kg)  08/12/21 155 lb 12.8 oz (70.7 kg)  07/18/21 157 lb (71.2 kg)      Assessment and Plan:   1. CAD without angina: Her findings on cardiac cath in January 2023 are thought to represent embolism of thrombus, likely LV thrombus, to the coronary arteries. She does not appear to have significant atherosclerotic CAD. Will continue ASA, beta blocker, statin and Eliquis.     2. LV thrombus: No LV thrombus seen on recent echo. Will continue Eliquis. As it is unlikely that her wall motion abnormality will resolve, consider long term anticoagulation.   3. Cardiomyopathy: Unclear if she has an apical wall motion abnormality at baseline or if this is new as there are no prior echocardiograms here. Cardiac MRI 08/08/21 with normal LV size with apical WMA, LVEF=41%.  Will continue beta blocker and Ace-inh.    4. Hyperlipidemia: Will need repeat lipids and LFTS now.  Continue statin.   5. Tobacco abuse: Smoking cessation encouraged.   6. Insomnia: Ambien prn  Labs/ tests ordered today include:   No orders of the defined types were placed in this  encounter.  Disposition:   F/U with me in 12 months.   Signed, Verne Carrow, MD 01/23/2022 3:44 PM    [facility] [address] [place], [place], [place]  [address] Phone: [phone]; Fax: [phone]

## 2022-01-23 NOTE — Patient Instructions (Signed)
Medication Instructions:  Your physician recommends that you continue on your current medications as directed. Please refer to the Current Medication list given to you today.  Ambien 5 mg - one tablet as needed for sleep has been sent to your pharmacy  *If you need a refill on your cardiac medications before your next appointment, please call your pharmacy*   Lab Work: none If you have labs (blood work) drawn today and your tests are completely normal, you will receive your results only by: MyChart Message (if you have MyChart) OR A paper copy in the mail If you have any lab test that is abnormal or we need to change your treatment, we will call you to review the results.   Testing/Procedures: none   Follow-Up: At [facility], you and your health needs are our priority.  As part of our continuing mission to provide you with exceptional heart care, we have created designated Provider Care Teams.  These Care Teams include your primary Cardiologist (physician) and Advanced Practice Providers (APPs -  Physician Assistants and Nurse Practitioners) who all work together to provide you with the care you need, when you need it.  We recommend signing up for the patient portal called "MyChart".  Sign up information is provided on this After Visit Summary.  MyChart is used to connect with patients for Virtual Visits (Telemedicine).  Patients are able to view lab/test results, encounter notes, upcoming appointments, etc.  Non-urgent messages can be sent to your provider as well.   To learn more about what you can do with MyChart, go to [email or web address].    Your next appointment:   12 month(s)  The format for your next appointment:   In Person  Provider:   Verne Carrow, MD     Important Information About Sugar

## 2022-08-09 ENCOUNTER — Telehealth: Payer: Self-pay

## 2022-08-09 MED ORDER — ZOLPIDEM TARTRATE 5 MG PO TABS: 5.0000 mg | ORAL_TABLET | Freq: Every evening | ORAL | 5 refills | Status: AC | PRN

## 2022-08-09 MED ORDER — ZOLPIDEM TARTRATE 5 MG PO TABS: 5.0000 mg | ORAL_TABLET | Freq: Every evening | ORAL | 5 refills | Status: DC | PRN

## 2022-08-09 NOTE — Telephone Encounter (Signed)
The patient's pharmacy requested a refill on the patient's Zolpidem medication.  The patient was last seen on 01/23/2022 and was told to return in 1 year.

## 2022-08-09 NOTE — Addendum Note (Signed)
Addended by: Rodman Key on: 08/09/2022 05:13 PM   Modules accepted: Orders

## 2022-08-09 NOTE — Telephone Encounter (Signed)
Call in refill for ambien #30, refill #5.

## 2023-01-13 IMAGING — MR MR CARD MORPHOLOGY WO/W CM
45 of 48 series · 45 of 48 positions shown · IV contrast (Contrast agent)
Comparison: none

CLINICAL DATA: LV thrombus

EXAM:
CARDIAC MRI
TECHNIQUE: The patient was scanned on a 1.5 Tesla GE magnet. A dedicated
cardiac coil was used. Functional imaging was done using Fiesta
sequences. [DATE], and 4 chamber views were done to assess for RWMA's.
Modified Dudushi rule using a short axis stack was used to
calculate an ejection fraction on a dedicated work station using
Circle software. The patient received 8 cc of Gadavist. After 10
minutes inversion recovery sequences were used to assess for
infiltration and scar tissue.
CONTRAST:  Gadavist 8 cc

[Series 4: t2_[id number]_tra_bh · axial · 8.0mm · 1.41mm/px · 1 of 16 slices shown]
[im 1/16]
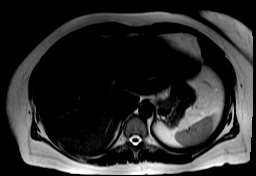

[Series 8: bSSFP · oblique · 8.0mm · 1.61mm/px · 1 of 25 slices shown (1 of 19)]
[im 1/25]
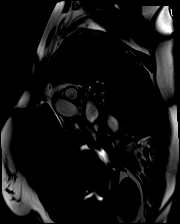

[Series 9: bSSFP · oblique · 8.0mm · 1.61mm/px · 1 of 25 slices shown (2 of 19)]
[im 1/25]
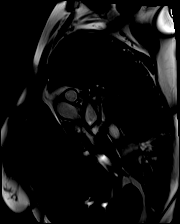

[Series 10: bSSFP · oblique · 8.0mm · 1.61mm/px · 1 of 25 slices shown (3 of 19)]
[im 1/25]
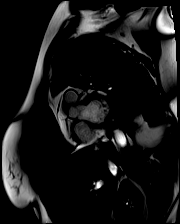

[Series 11: bSSFP · oblique · 8.0mm · 1.61mm/px · 1 of 25 slices shown (4 of 19)]
[im 1/25]
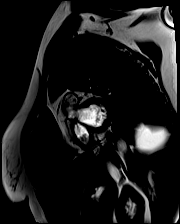

[Series 12: bSSFP · oblique · 8.0mm · 1.61mm/px · 1 of 25 slices shown (5 of 19)]
[im 1/25]
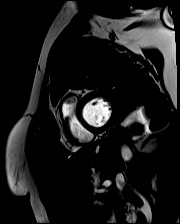

[Series 13: bSSFP · oblique · 8.0mm · 1.61mm/px · 1 of 25 slices shown (6 of 19)]
[im 1/25]
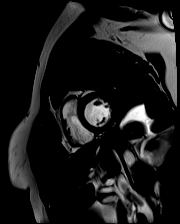

[Series 14: bSSFP · oblique · 8.0mm · 1.61mm/px · 1 of 25 slices shown (7 of 19)]
[im 1/25]
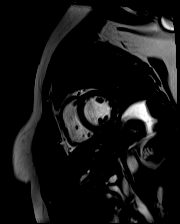

[Series 15: bSSFP · oblique · 8.0mm · 1.61mm/px · 1 of 25 slices shown (8 of 19)]
[im 1/25]
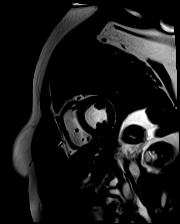

[Series 16: bSSFP · oblique · 8.0mm · 1.61mm/px · 1 of 25 slices shown (9 of 19)]
[im 1/25]
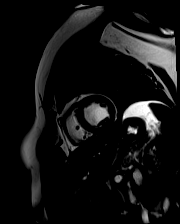

[Series 17: bSSFP · oblique · 8.0mm · 1.61mm/px · 1 of 25 slices shown (10 of 19)]
[im 1/25]
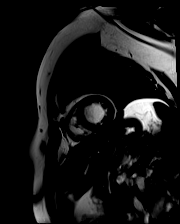

[Series 18: bSSFP · oblique · 8.0mm · 1.61mm/px · 1 of 25 slices shown (11 of 19)]
[im 1/25]
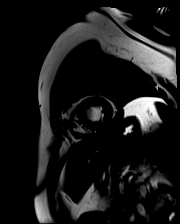

[Series 19: bSSFP · oblique · 8.0mm · 1.61mm/px · 1 of 25 slices shown (12 of 19)]
[im 1/25]
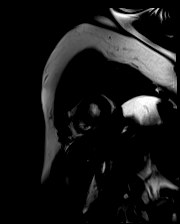

[Series 20: bSSFP · oblique · 8.0mm · 1.61mm/px · 1 of 25 slices shown (13 of 19)]
[im 1/25]
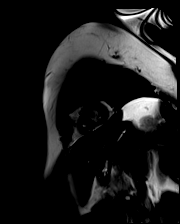

[Series 21: bSSFP · oblique · 8.0mm · 1.61mm/px · 1 of 25 slices shown (14 of 19)]
[im 1/25]
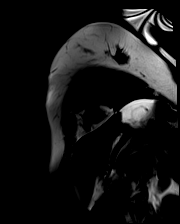

[Series 22: bSSFP · oblique · 8.0mm · 1.61mm/px · 1 of 25 slices shown (15 of 19)]
[im 1/25]
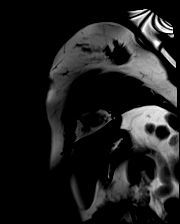

[Series 23: bSSFP · oblique · 8.0mm · 1.61mm/px · 1 of 25 slices shown (16 of 19)]
[im 1/25]
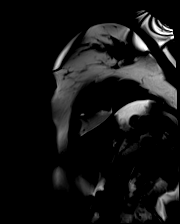

[Series 24: bSSFP · oblique · 6.0mm · 1.41mm/px · 1 of 25 slices shown (17 of 19)]
[im 1/25]
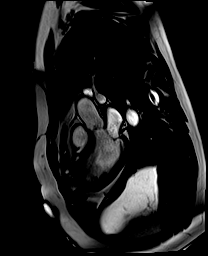

[Series 25: bSSFP · sagittal · 6.0mm · 1.41mm/px · 1 of 25 slices shown (18 of 19)]
[im 1/25]
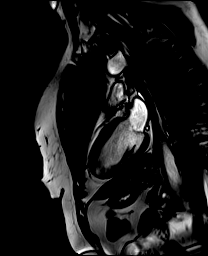

[Series 26: bSSFP · oblique · 6.0mm · 1.41mm/px · 1 of 25 slices shown (19 of 19)]
[im 1/25]
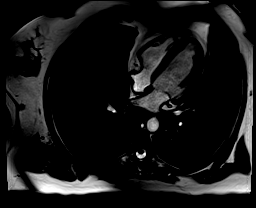

[Series 30: (id)_long_t1 · oblique · 8.0mm · 1.56mm/px · 1 of 24 slices shown]
[im 1/24]
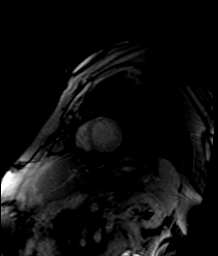

[Series 31: (id)_long_t1_moco · oblique · 8.0mm · 1.56mm/px · 1 of 24 slices shown]
[im 1/24]
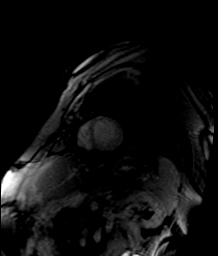

[Series 32: (id)_long_t1_moco_t1 · oblique · 8.0mm · 1.56mm/px · 1 of 6 slices shown]
[im 1/6]
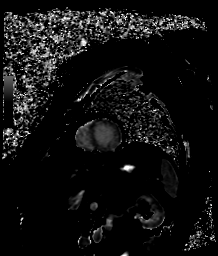

[Series 35: cine_trufi_short axis_cs_2_shot · oblique · 8.0mm · 1.48mm/px · 1 of 25 slices shown (1 of 17)]
[im 1/25]
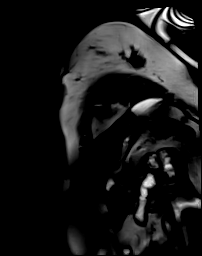

[Series 35: cine_trufi_short axis_cs_2_shot · oblique · 8.0mm · 1.48mm/px · 1 of 25 slices shown (2 of 17)]
[im 1/25]
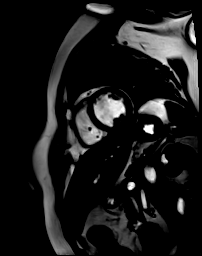

[Series 35: cine_trufi_short axis_cs_2_shot · oblique · 8.0mm · 1.48mm/px · 1 of 25 slices shown (3 of 17)]
[im 1/25]
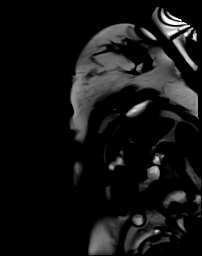

[Series 35: cine_trufi_short axis_cs_2_shot · oblique · 8.0mm · 1.48mm/px · 1 of 25 slices shown (4 of 17)]
[im 1/25]
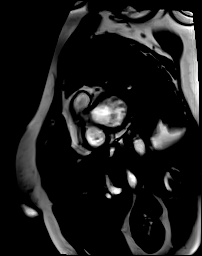

[Series 35: cine_trufi_short axis_cs_2_shot · oblique · 8.0mm · 1.48mm/px · 1 of 25 slices shown (5 of 17)]
[im 1/25]
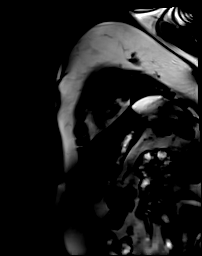

[Series 35: cine_trufi_short axis_cs_2_shot · oblique · 8.0mm · 1.48mm/px · 1 of 25 slices shown (6 of 17)]
[im 1/25]
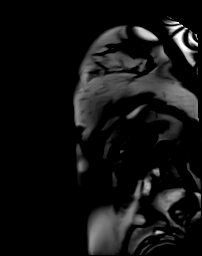

[Series 35: cine_trufi_short axis_cs_2_shot · oblique · 8.0mm · 1.48mm/px · 1 of 25 slices shown (7 of 17)]
[im 1/25]
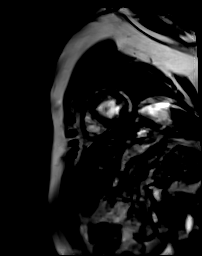

[Series 35: cine_trufi_short axis_cs_2_shot · oblique · 8.0mm · 1.48mm/px · 1 of 25 slices shown (8 of 17)]
[im 1/25]
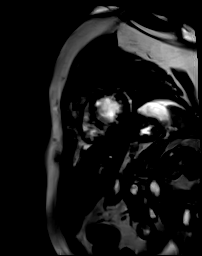

[Series 35: cine_trufi_short axis_cs_2_shot · oblique · 8.0mm · 1.48mm/px · 1 of 25 slices shown (9 of 17)]
[im 1/25]
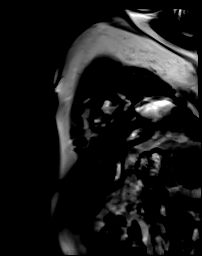

[Series 35: cine_trufi_short axis_cs_2_shot · oblique · 8.0mm · 1.48mm/px · 1 of 25 slices shown (10 of 17)]
[im 1/25]
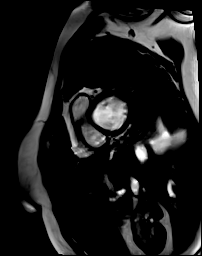

[Series 35: cine_trufi_short axis_cs_2_shot · oblique · 8.0mm · 1.48mm/px · 1 of 25 slices shown (11 of 17)]
[im 1/25]
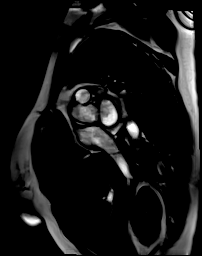

[Series 35: cine_trufi_short axis_cs_2_shot · oblique · 8.0mm · 1.48mm/px · 1 of 25 slices shown (12 of 17)]
[im 1/25]
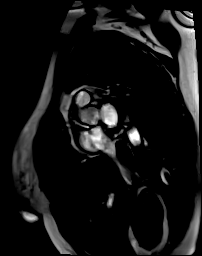

[Series 35: cine_trufi_short axis_cs_2_shot · oblique · 8.0mm · 1.48mm/px · 1 of 25 slices shown (13 of 17)]
[im 1/25]
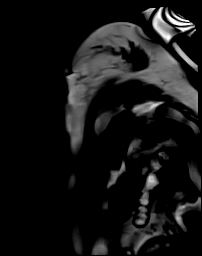

[Series 35: cine_trufi_short axis_cs_2_shot · oblique · 8.0mm · 1.48mm/px · 1 of 25 slices shown (14 of 17)]
[im 1/25]
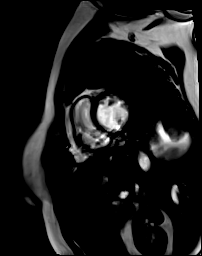

[Series 35: cine_trufi_short axis_cs_2_shot · oblique · 8.0mm · 1.48mm/px · 1 of 25 slices shown (15 of 17)]
[im 1/25]
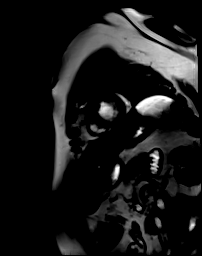

[Series 35: cine_trufi_short axis_cs_2_shot · oblique · 8.0mm · 1.48mm/px · 1 of 25 slices shown (16 of 17)]
[im 1/25]
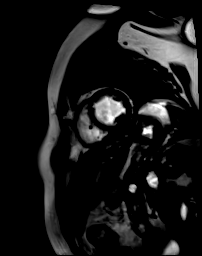

[Series 35: cine_trufi_short axis_cs_2_shot · oblique · 8.0mm · 1.48mm/px · 1 of 25 slices shown (17 of 17)]
[im 1/25]
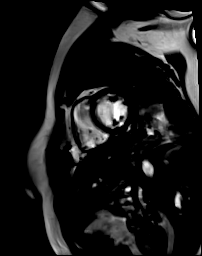

[Series 36: cine_trufi_long axis_cs_2_shot · oblique · 8.0mm · 1.48mm/px · 1 of 25 slices shown (1 of 5)]
[im 1/25]
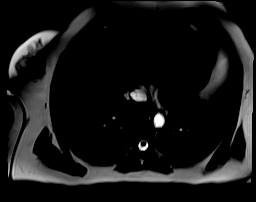

[Series 36: cine_trufi_long axis_cs_2_shot · oblique · 8.0mm · 1.48mm/px · 1 of 25 slices shown (2 of 5)]
[im 1/25]
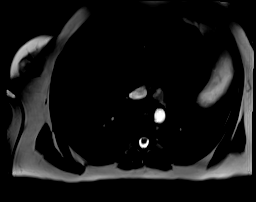

[Series 36: cine_trufi_long axis_cs_2_shot · oblique · 8.0mm · 1.48mm/px · 1 of 25 slices shown (3 of 5)]
[im 1/25]
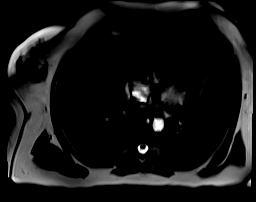

[Series 36: cine_trufi_long axis_cs_2_shot · oblique · 8.0mm · 1.48mm/px · 1 of 25 slices shown (4 of 5)]
[im 1/25]
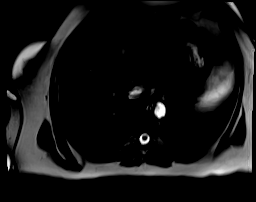

[Series 36: cine_trufi_long axis_cs_2_shot · oblique · 8.0mm · 1.48mm/px · 1 of 25 slices shown (5 of 5)]
[im 1/25]
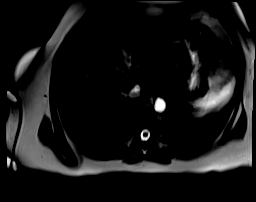

[45 of 48 positions shown; findings below may reference images not displayed]

FINDINGS: Limited images of the lung fields showed no gross abnormalities.

Normal left ventricular size and wall thickness. Akinesis of the
true apex and the mid-apical inferolateral and inferior walls.
Overall LV EF 41%. No LV thrombus noted. Normal right ventricular
size and systolic function, EF 50%. Normal right and left atrial
sizes. No significant mitral regurgitation. Trileaflet aortic valve
with no significant regurgitation or stenosis.

Delayed enhancement imaging:

>50% wall thickness subendocardial late gadolinium enhancement (LGE)
in the mid-apical inferior and inferolateral walls and the true
apex.

MEASUREMENTS:
MEASUREMENTS
LVEDV 115 mL
LVSV 48 mL
LVEF 41%

RVEDV 103 mL
RVSV 51 mL
RVEF 50%
IMPRESSION: 1. Normal LV size with wall motion abnormalities as noted above. EF
41%.

2.  No LV thrombus noted.

3.  Normal RV size and systolic function, EF 50%.

4. Delayed enhancement with evidence for prior MI in the mid-apical
inferior and inferolateral walls and the apex.

Iveth Gade
